# Patient Record
Sex: Female | Born: 1975 | Race: White | Hispanic: No | Marital: Married | Smoking: Never smoker
Health system: Southern US, Community
[De-identification: ages and names within clinical notes are randomized; demographics above are authoritative.]

## PROBLEM LIST (undated history)

## (undated) DIAGNOSIS — R87619 Unspecified abnormal cytological findings in specimens from cervix uteri: Secondary | ICD-10-CM

## (undated) DIAGNOSIS — D649 Anemia, unspecified: Secondary | ICD-10-CM

## (undated) DIAGNOSIS — Z87448 Personal history of other diseases of urinary system: Secondary | ICD-10-CM

## (undated) DIAGNOSIS — Z8661 Personal history of infections of the central nervous system: Secondary | ICD-10-CM

## (undated) DIAGNOSIS — Z87440 Personal history of urinary (tract) infections: Secondary | ICD-10-CM

## (undated) DIAGNOSIS — IMO0002 Reserved for concepts with insufficient information to code with codable children: Secondary | ICD-10-CM

## (undated) HISTORY — DX: Personal history of other diseases of urinary system: Z87.448

## (undated) HISTORY — PX: WISDOM TOOTH EXTRACTION: SHX21

## (undated) HISTORY — DX: Personal history of urinary (tract) infections: Z87.440

## (undated) HISTORY — DX: Personal history of infections of the central nervous system: Z86.61

## (undated) HISTORY — DX: Reserved for concepts with insufficient information to code with codable children: IMO0002

## (undated) HISTORY — PX: TONSILLECTOMY: SUR1361

## (undated) HISTORY — DX: Unspecified abnormal cytological findings in specimens from cervix uteri: R87.619

## (undated) HISTORY — DX: Anemia, unspecified: D64.9

---

## 2011-10-22 NOTE — L&D Delivery Note (Signed)
Delivery Note At 12:36 PM a viable and healthy female was delivered via Vaginal, Spontaneous Delivery (Presentation: Right Occiput Anterior).  APGAR: 9, 9; weight .   Placenta status: Intact, Spontaneous.  Cord: 3 vessels with the following complications: None, although question placental abruption at the time of delivery.   Anesthesia: Epidural  Episiotomy: None Lacerations: None Suture Repair: none Est. Blood Loss (mL): 300  Mom to postpartum.  Baby to nursery-stable.  Yannis Broce 06/29/2012, 1:08 PM

## 2011-10-22 NOTE — L&D Delivery Note (Signed)
I was present for the delivery and agree with above.  Susan Hayes, [place] 06/29/2012 7:13 PM

## 2011-12-16 ENCOUNTER — Encounter: Payer: Self-pay | Admitting: *Deleted

## 2011-12-16 ENCOUNTER — Ambulatory Visit: Payer: 59 | Admitting: *Deleted

## 2011-12-16 VITALS — BP 105/59 | Temp 98.5°F | Ht 63.0 in | Wt 118.0 lb

## 2011-12-16 DIAGNOSIS — Z3480 Encounter for supervision of other normal pregnancy, unspecified trimester: Secondary | ICD-10-CM

## 2011-12-16 NOTE — Progress Notes (Signed)
Pt and husband are here for NOB intake.  She is G4 P3 and delivered all babies @ [facility] with [place].  Couple are excited .  Husband has 2 children from a first marriage and they have 5 all together.  CRl today is 27mm and FHT 168bpm gest age is 8w 3 days. Which is appropriate for LMP.  PNL drawn today and is scheduled to return in 1-2 weeks for  Prenatal exam.  Pt to sign records for  Last pap which was 7 months ago post partum.

## 2011-12-17 LAB — OBSTETRIC PANEL
Basophils Absolute: 0 10*3/uL (ref 0.0–0.1)
HCT: 38.7 % (ref 36.0–46.0)
Hepatitis B Surface Ag: NEGATIVE
Lymphocytes Relative: 26 % (ref 12–46)
Monocytes Absolute: 0.5 10*3/uL (ref 0.1–1.0)
Neutro Abs: 7 10*3/uL (ref 1.7–7.7)
Platelets: 255 10*3/uL (ref 150–400)
RDW: 14.3 % (ref 11.5–15.5)
Rubella: 24.1 IU/mL — ABNORMAL HIGH
WBC: 10.3 10*3/uL (ref 4.0–10.5)

## 2011-12-19 LAB — CULTURE, URINE COMPREHENSIVE

## 2011-12-19 MED ORDER — NITROFURANTOIN MONOHYD MACRO 100 MG PO CAPS: 100.0000 mg | ORAL_CAPSULE | Freq: Two times a day (BID) | ORAL | Status: AC

## 2012-01-03 ENCOUNTER — Encounter: Payer: 59 | Admitting: Advanced Practice Midwife

## 2012-01-27 ENCOUNTER — Encounter: Payer: Self-pay | Admitting: Obstetrics and Gynecology

## 2012-01-27 ENCOUNTER — Ambulatory Visit (INDEPENDENT_AMBULATORY_CARE_PROVIDER_SITE_OTHER): Payer: 59 | Admitting: Obstetrics and Gynecology

## 2012-01-27 VITALS — BP 110/69 | Temp 98.6°F | Wt 125.0 lb

## 2012-01-27 DIAGNOSIS — Z124 Encounter for screening for malignant neoplasm of cervix: Secondary | ICD-10-CM

## 2012-01-27 DIAGNOSIS — Z3480 Encounter for supervision of other normal pregnancy, unspecified trimester: Secondary | ICD-10-CM

## 2012-01-27 DIAGNOSIS — Z1151 Encounter for screening for human papillomavirus (HPV): Secondary | ICD-10-CM

## 2012-01-27 DIAGNOSIS — Z113 Encounter for screening for infections with a predominantly sexual mode of transmission: Secondary | ICD-10-CM

## 2012-01-27 NOTE — Progress Notes (Signed)
[id number] 

## 2012-01-27 NOTE — Patient Instructions (Addendum)
Pregnancy - Second Trimester The second trimester of pregnancy (3 to 6 months) is a period of rapid growth for you and your baby. At the end of the sixth month, your baby is about 9 inches long and weighs 1 1/2 pounds. You will begin to feel the baby move between 18 and 20 weeks of the pregnancy. This is called quickening. Weight gain is faster. A clear fluid (colostrum) may leak out of your breasts. You may feel small contractions of the womb (uterus). This is known as false labor or Braxton-Hicks contractions. This is like a practice for labor when the baby is ready to be born. Usually, the problems with morning sickness have usually passed by the end of your first trimester. Some women develop small dark blotches (called cholasma, mask of pregnancy) on their face that usually goes away after the baby is born. Exposure to the sun makes the blotches worse. Acne may also develop in some pregnant women and pregnant women who have acne, may find that it goes away. PRENATAL EXAMS  Blood work may continue to be done during prenatal exams. These tests are done to check on your health and the probable health of your baby. Blood work is used to follow your blood levels (hemoglobin). Anemia (low hemoglobin) is common during pregnancy. Iron and vitamins are given to help prevent this. You will also be checked for diabetes between 24 and 28 weeks of the pregnancy. Some of the previous blood tests may be repeated.   The size of the uterus is measured during each visit. This is to make sure that the baby is continuing to grow properly according to the dates of the pregnancy.   Your blood pressure is checked every prenatal visit. This is to make sure you are not getting toxemia.   Your urine is checked to make sure you do not have an infection, diabetes or protein in the urine.   Your weight is checked often to make sure gains are happening at the suggested rate. This is to ensure that both you and your baby are  growing normally.   Sometimes, an ultrasound is performed to confirm the proper growth and development of the baby. This is a test which bounces harmless sound waves off the baby so your caregiver can more accurately determine due dates.  Sometimes, a specialized test is done on the amniotic fluid surrounding the baby. This test is called an amniocentesis. The amniotic fluid is obtained by sticking a needle into the belly (abdomen). This is done to check the chromosomes in instances where there is a concern about possible genetic problems with the baby. It is also sometimes done near the end of pregnancy if an early delivery is required. In this case, it is done to help make sure the baby's lungs are mature enough for the baby to live outside of the womb. CHANGES OCCURING IN THE SECOND TRIMESTER OF PREGNANCY Your body goes through many changes during pregnancy. They vary from person to person. Talk to your caregiver about changes you notice that you are concerned about.  During the second trimester, you will likely have an increase in your appetite. It is normal to have cravings for certain foods. This varies from person to person and pregnancy to pregnancy.   Your lower abdomen will begin to bulge.   You may have to urinate more often because the uterus and baby are pressing on your bladder. It is also common to get more bladder infections during pregnancy (  pain with urination). You can help this by drinking lots of fluids and emptying your bladder before and after intercourse.   You may begin to get stretch marks on your hips, abdomen, and breasts. These are normal changes in the body during pregnancy. There are no exercises or medications to take that prevent this change.   You may begin to develop swollen and bulging veins (varicose veins) in your legs. Wearing support hose, elevating your feet for 15 minutes, 3 to 4 times a day and limiting salt in your diet helps lessen the problem.    Heartburn may develop as the uterus grows and pushes up against the stomach. Antacids recommended by your caregiver helps with this problem. Also, eating smaller meals 4 to 5 times a day helps.   Constipation can be treated with a stool softener or adding bulk to your diet. Drinking lots of fluids, vegetables, fruits, and whole grains are helpful.   Exercising is also helpful. If you have been very active up until your pregnancy, most of these activities can be continued during your pregnancy. If you have been less active, it is helpful to start an exercise program such as walking.   Hemorrhoids (varicose veins in the rectum) may develop at the end of the second trimester. Warm sitz baths and hemorrhoid cream recommended by your caregiver helps hemorrhoid problems.   Backaches may develop during this time of your pregnancy. Avoid heavy lifting, wear low heal shoes and practice good posture to help with backache problems.   Some pregnant women develop tingling and numbness of their hand and fingers because of swelling and tightening of ligaments in the wrist (carpel tunnel syndrome). This goes away after the baby is born.   As your breasts enlarge, you may have to get a bigger bra. Get a comfortable, cotton, support bra. Do not get a nursing bra until the last month of the pregnancy if you will be nursing the baby.   You may get a dark line from your belly button to the pubic area called the linea nigra.   You may develop rosy cheeks because of increase blood flow to the face.   You may develop spider looking lines of the face, neck, arms and chest. These go away after the baby is born.  HOME CARE INSTRUCTIONS   It is extremely important to avoid all smoking, herbs, alcohol, and unprescribed drugs during your pregnancy. These chemicals affect the formation and growth of the baby. Avoid these chemicals throughout the pregnancy to ensure the delivery of a healthy infant.   Most of your home  care instructions are the same as suggested for the first trimester of your pregnancy. Keep your caregiver's appointments. Follow your caregiver's instructions regarding medication use, exercise and diet.   During pregnancy, you are providing food for you and your baby. Continue to eat regular, well-balanced meals. Choose foods such as meat, fish, milk and other low fat dairy products, vegetables, fruits, and whole-grain breads and cereals. Your caregiver will tell you of the ideal weight gain.   A physical sexual relationship may be continued up until near the end of pregnancy if there are no other problems. Problems could include early (premature) leaking of amniotic fluid from the membranes, vaginal bleeding, abdominal pain, or other medical or pregnancy problems.   Exercise regularly if there are no restrictions. Check with your caregiver if you are unsure of the safety of some of your exercises. The greatest weight gain will occur in the   last 2 trimesters of pregnancy. Exercise will help you:   Control your weight.   Get you in shape for labor and delivery.   Lose weight after you have the baby.   Wear a good support or jogging bra for breast tenderness during pregnancy. This may help if worn during sleep. Pads or tissues may be used in the bra if you are leaking colostrum.   Do not use hot tubs, steam rooms or saunas throughout the pregnancy.   Wear your seat belt at all times when driving. This protects you and your baby if you are in an accident.   Avoid raw meat, uncooked cheese, cat litter boxes and soil used by cats. These carry germs that can cause birth defects in the baby.   The second trimester is also a good time to visit your dentist for your dental health if this has not been done yet. Getting your teeth cleaned is OK. Use a soft toothbrush. Brush gently during pregnancy.   It is easier to loose urine during pregnancy. Tightening up and strengthening the pelvic muscles will  help with this problem. Practice stopping your urination while you are going to the bathroom. These are the same muscles you need to strengthen. It is also the muscles you would use as if you were trying to stop from passing gas. You can practice tightening these muscles up 10 times a set and repeating this about 3 times per day. Once you know what muscles to tighten up, do not perform these exercises during urination. It is more likely to contribute to an infection by backing up the urine.   Ask for help if you have financial, counseling or nutritional needs during pregnancy. Your caregiver will be able to offer counseling for these needs as well as refer you for other special needs.   Your skin may become oily. If so, wash your face with mild soap, use non-greasy moisturizer and oil or cream based makeup.  MEDICATIONS AND DRUG USE IN PREGNANCY  Take prenatal vitamins as directed. The vitamin should contain 1 milligram of folic acid. Keep all vitamins out of reach of children. Only a couple vitamins or tablets containing iron may be fatal to a baby or young child when ingested.   Avoid use of all medications, including herbs, over-the-counter medications, not prescribed or suggested by your caregiver. Only take over-the-counter or prescription medicines for pain, discomfort, or fever as directed by your caregiver. Do not use aspirin.   Let your caregiver also know about herbs you may be using.   Alcohol is related to a number of birth defects. This includes fetal alcohol syndrome. All alcohol, in any form, should be avoided completely. Smoking will cause low birth rate and premature babies.   Street or illegal drugs are very harmful to the baby. They are absolutely forbidden. A baby born to an addicted mother will be addicted at birth. The baby will go through the same withdrawal an adult does.  SEEK MEDICAL CARE IF:  You have any concerns or worries during your pregnancy. It is better to call with  your questions if you feel they cannot wait, rather than worry about them. SEEK IMMEDIATE MEDICAL CARE IF:   An unexplained oral temperature above 102 F (38.9 C) develops, or as your caregiver suggests.   You have leaking of fluid from the vagina (birth canal). If leaking membranes are suspected, take your temperature and tell your caregiver of this when you call.   There   is vaginal spotting, bleeding, or passing clots. Tell your caregiver of the amount and how many pads are used. Light spotting in pregnancy is common, especially following intercourse.   You develop a bad smelling vaginal discharge with a change in the color from clear to white.   You continue to feel sick to your stomach (nauseated) and have no relief from remedies suggested. You vomit blood or coffee ground-like materials.   You lose more than 2 pounds of weight or gain more than 2 pounds of weight over 1 week, or as suggested by your caregiver.   You notice swelling of your face, hands, feet, or legs.   You get exposed to [place] measles and have never had them.   You are exposed to fifth disease or chickenpox.   You develop belly (abdominal) pain. Round ligament discomfort is a common non-cancerous (benign) cause of abdominal pain in pregnancy. Your caregiver still must evaluate you.   You develop a bad headache that does not go away.   You develop fever, diarrhea, pain with urination, or shortness of breath.   You develop visual problems, blurry, or double vision.   You fall or are in a car accident or any kind of trauma.   There is mental or physical violence at home.  Document Released: 10/01/2001 Document Revised: 09/26/2011 Document Reviewed: 04/05/2009 [facility] Patient Information 2012 [place], [place]. Pregnancy - First Trimester During sexual intercourse, millions of sperm go into the vagina. Only 1 sperm will penetrate and fertilize the female egg while it is in the Fallopian tube. One week later, the  fertilized egg implants into the wall of the uterus. An embryo begins to develop into a baby. At 6 to 8 weeks, the eyes and face are formed and the heartbeat can be seen on ultrasound. At the end of 12 weeks (first trimester), all the baby's organs are formed. Now that you are pregnant, you will want to do everything you can to have a healthy baby. Two of the most important things are to get good prenatal care and follow your caregiver's instructions. Prenatal care is all the medical care you receive before the baby's birth. It is given to prevent, find, and treat problems during the pregnancy and childbirth. PRENATAL EXAMS  During prenatal visits, your weight, blood pressure and urine are checked. This is done to make sure you are healthy and progressing normally during the pregnancy.   A pregnant woman should gain 25 to 35 pounds during the pregnancy. However, if you are over weight or underweight, your caregiver will advise you regarding your weight.   Your caregiver will ask and answer questions for you.   Blood work, cervical cultures, other necessary tests and a Pap test are done during your prenatal exams. These tests are done to check on your health and the probable health of your baby. Tests are strongly recommended and done for HIV with your permission. This is the virus that causes AIDS. These tests are done because medications can be given to help prevent your baby from being born with this infection should you have been infected without knowing it. Blood work is also used to find out your blood type, previous infections and follow your blood levels (hemoglobin).   Low hemoglobin (anemia) is common during pregnancy. Iron and vitamins are given to help prevent this. Later in the pregnancy, blood tests for diabetes will be done along with any other tests if any problems develop. You may need tests to make sure  you and the baby are doing well.   You may need other tests to make sure you and the  baby are doing well.  CHANGES DURING THE FIRST TRIMESTER (THE FIRST 3 MONTHS OF PREGNANCY) Your body goes through many changes during pregnancy. They vary from person to person. Talk to your caregiver about changes you notice and are concerned about. Changes can include:  Your menstrual period stops.   The egg and sperm carry the genes that determine what you look like. Genes from you and your partner are forming a baby. The female genes determine whether the baby is a boy or a girl.   Your body increases in girth and you may feel bloated.   Feeling sick to your stomach (nauseous) and throwing up (vomiting). If the vomiting is uncontrollable, call your caregiver.   Your breasts will begin to enlarge and become tender.   Your nipples may stick out more and become darker.   The need to urinate more. Painful urination may mean you have a bladder infection.   Tiring easily.   Loss of appetite.   Cravings for certain kinds of food.   At first, you may gain or lose a couple of pounds.   You may have changes in your emotions from day to day (excited to be pregnant or concerned something may go wrong with the pregnancy and baby).   You may have more vivid and strange dreams.  HOME CARE INSTRUCTIONS   It is very important to avoid all smoking, alcohol and un-prescribed drugs during your pregnancy. These affect the formation and growth of the baby. Avoid chemicals while pregnant to ensure the delivery of a healthy infant.   Start your prenatal visits by the 12th week of pregnancy. They are usually scheduled monthly at first, then more often in the last 2 months before delivery. Keep your caregiver's appointments. Follow your caregiver's instructions regarding medication use, blood and lab tests, exercise, and diet.   During pregnancy, you are providing food for you and your baby. Eat regular, well-balanced meals. Choose foods such as meat, fish, milk and other low fat dairy products,  vegetables, fruits, and whole-grain breads and cereals. Your caregiver will tell you of the ideal weight gain.   You can help morning sickness by keeping soda crackers at the bedside. Eat a couple before arising in the morning. You may want to use the crackers without salt on them.   Eating 4 to 5 small meals rather than 3 large meals a day also may help the nausea and vomiting.   Drinking liquids between meals instead of during meals also seems to help nausea and vomiting.   A physical sexual relationship may be continued throughout pregnancy if there are no other problems. Problems may be early (premature) leaking of amniotic fluid from the membranes, vaginal bleeding, or belly (abdominal) pain.   Exercise regularly if there are no restrictions. Check with your caregiver or physical therapist if you are unsure of the safety of some of your exercises. Greater weight gain will occur in the last 2 trimesters of pregnancy. Exercising will help:   Control your weight.   Keep you in shape.   Prepare you for labor and delivery.   Help you lose your pregnancy weight after you deliver your baby.   Wear a good support or jogging bra for breast tenderness during pregnancy. This may help if worn during sleep too.   Ask when prenatal classes are available. Begin classes when they  are offered.   Do not use hot tubs, steam rooms or saunas.   Wear your seat belt when driving. This protects you and your baby if you are in an accident.   Avoid raw meat, uncooked cheese, cat litter boxes and soil used by cats throughout the pregnancy. These carry germs that can cause birth defects in the baby.   The first trimester is a good time to visit your dentist for your dental health. Getting your teeth cleaned is OK. Use a softer toothbrush and brush gently during pregnancy.   Ask for help if you have financial, counseling or nutritional needs during pregnancy. Your caregiver will be able to offer counseling  for these needs as well as refer you for other special needs.   Do not take any medications or herbs unless told by your caregiver.   Inform your caregiver if there is any mental or physical domestic violence.   Make a list of emergency phone numbers of family, friends, hospital, and police and fire departments.   Write down your questions. Take them to your prenatal visit.   Do not douche.   Do not cross your legs.   If you have to stand for long periods of time, rotate you feet or take small steps in a circle.   You may have more vaginal secretions that may require a sanitary pad. Do not use tampons or scented sanitary pads.  MEDICATIONS AND DRUG USE IN PREGNANCY  Take prenatal vitamins as directed. The vitamin should contain 1 milligram of folic acid. Keep all vitamins out of reach of children. Only a couple vitamins or tablets containing iron may be fatal to a baby or young child when ingested.   Avoid use of all medications, including herbs, over-the-counter medications, not prescribed or suggested by your caregiver. Only take over-the-counter or prescription medicines for pain, discomfort, or fever as directed by your caregiver. Do not use aspirin, ibuprofen, or naproxen unless directed by your caregiver.   Let your caregiver also know about herbs you may be using.   Alcohol is related to a number of birth defects. This includes fetal alcohol syndrome. All alcohol, in any form, should be avoided completely. Smoking will cause low birth rate and premature babies.   Street or illegal drugs are very harmful to the baby. They are absolutely forbidden. A baby born to an addicted mother will be addicted at birth. The baby will go through the same withdrawal an adult does.   Let your caregiver know about any medications that you have to take and for what reason you take them.  MISCARRIAGE IS COMMON DURING PREGNANCY A miscarriage does not mean you did something wrong. It is not a reason  to worry about getting pregnant again. Your caregiver will help you with questions you may have. If you have a miscarriage, you may need minor surgery. SEEK MEDICAL CARE IF:  You have any concerns or worries during your pregnancy. It is better to call with your questions if you feel they cannot wait, rather than worry about them. SEEK IMMEDIATE MEDICAL CARE IF:   An unexplained oral temperature above 102 F (38.9 C) develops, or as your caregiver suggests.   You have leaking of fluid from the vagina (birth canal). If leaking membranes are suspected, take your temperature and inform your caregiver of this when you call.   There is vaginal spotting or bleeding. Notify your caregiver of the amount and how many pads are used.   You  develop a bad smelling vaginal discharge with a change in the color.   You continue to feel sick to your stomach (nauseated) and have no relief from remedies suggested. You vomit blood or coffee ground-like materials.   You lose more than 2 pounds of weight in 1 week.   You gain more than 2 pounds of weight in 1 week and you notice swelling of your face, hands, feet, or legs.   You gain 5 pounds or more in 1 week (even if you do not have swelling of your hands, face, legs, or feet).   You get exposed to [place] measles and have never had them.   You are exposed to fifth disease or chickenpox.   You develop belly (abdominal) pain. Round ligament discomfort is a common non-cancerous (benign) cause of abdominal pain in pregnancy. Your caregiver still must evaluate this.   You develop headache, fever, diarrhea, pain with urination, or shortness of breath.   You fall or are in a car accident or have any kind of trauma.   There is mental or physical violence in your home.  Document Released: 10/01/2001 Document Revised: 09/26/2011 Document Reviewed: 04/04/2009 [facility] Patient Information 2012 [place], [place].

## 2012-01-27 NOTE — Progress Notes (Signed)
   Subjective:    Susan Hayes is a [id number] [redacted]w[redacted]d being seen today for her first obstetrical visit.  Her obstetrical history is significant for no OB RF. Marland Kitchen Patient does intend to breast feed. Pregnancy history fully reviewed.  Patient reports nausea and no cramping.  Filed Vitals:   01/27/12 1052  BP: 110/69  Temp: 98.6 F (37 C)  Weight: 125 lb (56.7 kg)    HISTORY: OB History    Grav Para Term Preterm Abortions TAB SAB Ect Mult Living   4 3 3       3      # Outc Date GA Lbr Len/2nd Wgt Sex Del Anes PTL Lv   1 TRM 4/03 [redacted]w[redacted]d  7lb(3.175kg) M SVD EPI No Yes   2 TRM 3/05 [redacted]w[redacted]d  7lb2oz(3.232kg) M SVD EPI No Yes   3 TRM 6/12 [redacted]w[redacted]d   F SVD None No Yes   4 CUR              Past Medical History  Diagnosis Date  . Anemia   . Abnormal Pap smear     Ascus  HPV   Past Surgical History  Procedure Date  . Tonsillectomy   . Wisdom tooth extraction    History reviewed. No pertinent family history.   Exam    Uterine Size: 2/3 to U  Pelvic Exam:    Perineum: Normal Perineum   Vulva: normal, Bartholin's, Urethra, Skene's normal, female escutcheon   Vagina:  normal mucosa, normal discharge       Cervix: multiparous appearance, no bleeding following Pap, no lesions and small cystocele   Adnexa: no mass, fullness, tenderness   Bony Pelvis: gynecoid  System: Breast:  normal appearance, no masses or tenderness   Skin: normal coloration and turgor, no rashes    Neurologic: oriented, normal, normal mood, oriented   Extremities: normal strength, tone, and muscle mass   HEENT PERRLA and thyroid without masses   Mouth/Teeth mucous membranes moist, pharynx normal without lesions   Neck supple and no masses   Cardiovascular: regular rate and rhythm   Respiratory:  appears well, vitals normal, no respiratory distress, acyanotic, normal RR, neck free of mass or lymphadenopathy, chest clear, no wheezing, crepitations, rhonchi, normal symmetric air entry   Abdomen: NT   Urinary: urethral  meatus normal and urethral meatus with mucosal prolapse      Assessment:    Pregnancy: [id number] Patient Active Problem List  Diagnoses  . Supervision of normal subsequent pregnancy        Plan:     Initial labs drawn. Prenatal vitamins. Problem list reviewed and updated. Genetic Screening discussed Quad Screen: undecided.  Ultrasound discussed; fetal survey: requested.  Follow up in 4 weeks. 50% of 30 min visit spent on counseling and coordination of care.  Explained midwifery care and visit schedule   Susan Hayes 01/27/2012

## 2012-01-30 LAB — OB RESULTS CONSOLE GC/CHLAMYDIA: Gonorrhea: NEGATIVE

## 2012-01-31 ENCOUNTER — Telehealth: Payer: Self-pay | Admitting: *Deleted

## 2012-01-31 NOTE — Telephone Encounter (Signed)
Appt for anatomy U/S scheduled for 02/24/12 @ 9:15 @ [facility]

## 2012-02-24 ENCOUNTER — Ambulatory Visit (HOSPITAL_COMMUNITY)
Admission: RE | Admit: 2012-02-24 | Discharge: 2012-02-24 | Disposition: A | Discharge status: Home / Self Care | Payer: 59 | Source: Ambulatory Visit | Attending: Obstetrics and Gynecology | Admitting: Obstetrics and Gynecology

## 2012-02-24 DIAGNOSIS — Z363 Encounter for antenatal screening for malformations: Secondary | ICD-10-CM | POA: Insufficient documentation

## 2012-02-24 DIAGNOSIS — O358XX0 Maternal care for other (suspected) fetal abnormality and damage, not applicable or unspecified: Secondary | ICD-10-CM | POA: Insufficient documentation

## 2012-02-24 DIAGNOSIS — Z1389 Encounter for screening for other disorder: Secondary | ICD-10-CM | POA: Insufficient documentation

## 2012-02-24 DIAGNOSIS — Z3480 Encounter for supervision of other normal pregnancy, unspecified trimester: Secondary | ICD-10-CM

## 2012-02-24 IMAGING — US US OB DETAIL+14 WK
1 series · 12 of 28 positions shown · non-contrast
Comparison: none

[Series 1: us ob detail +14 wk · 87 acquisitions, 12 frames shown]
[im 4/87]
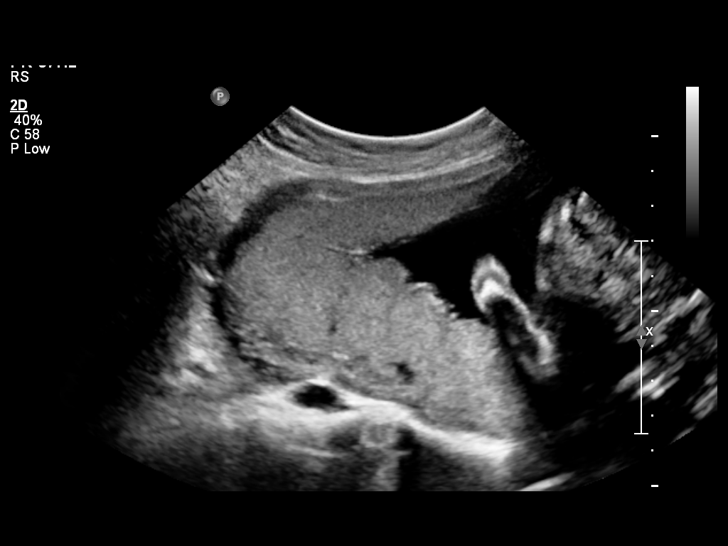
[im 10/87]
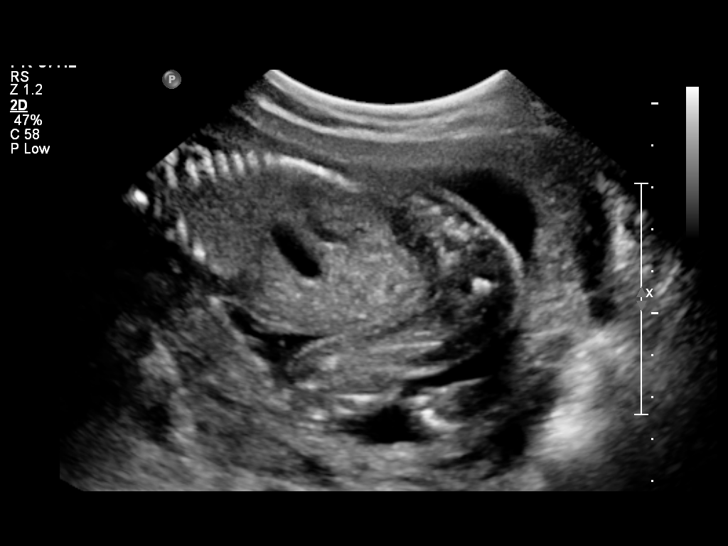
[im 16/87]
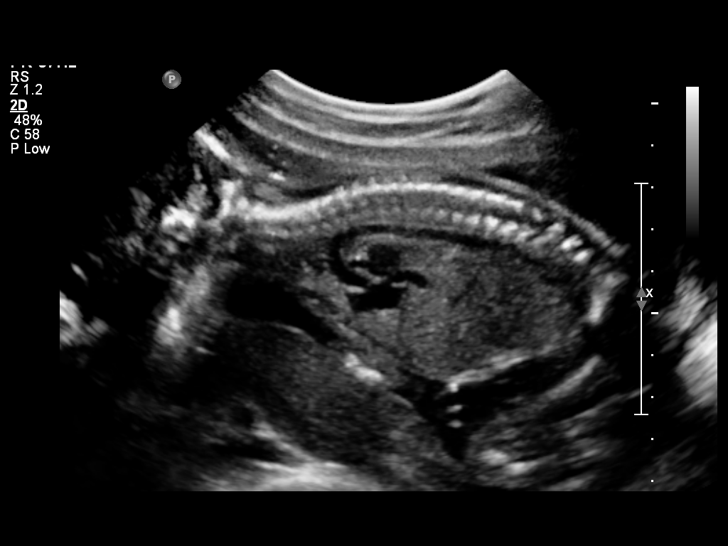
[im 26/87]
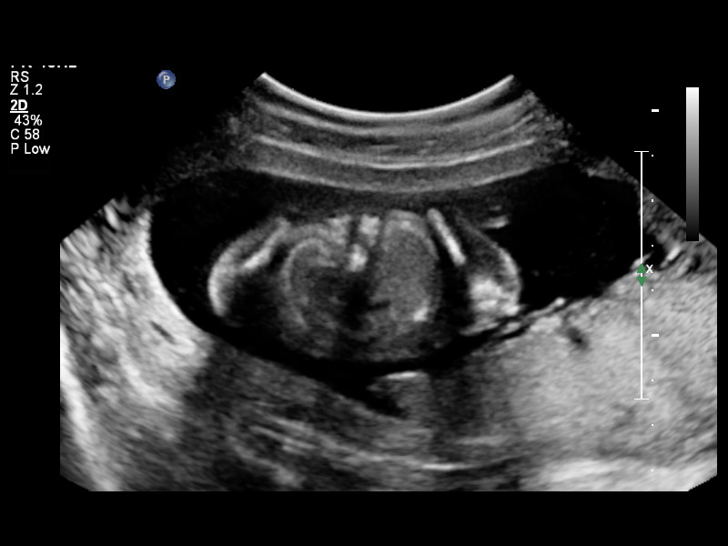
[im 32/87]
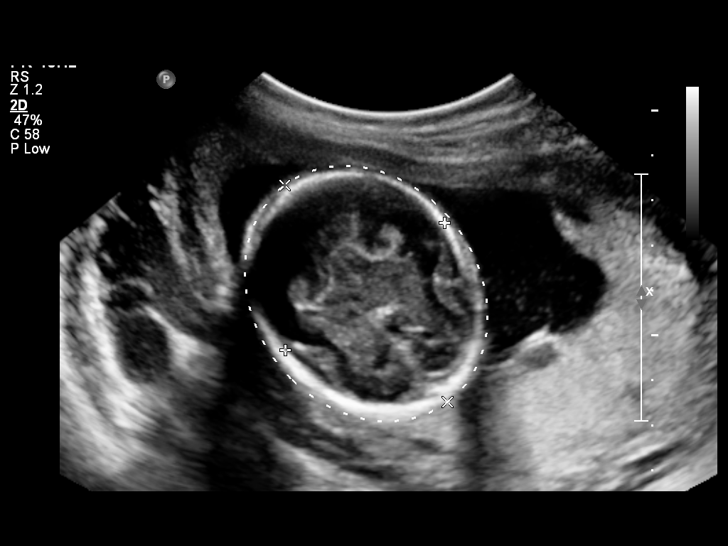
[im 39/87]
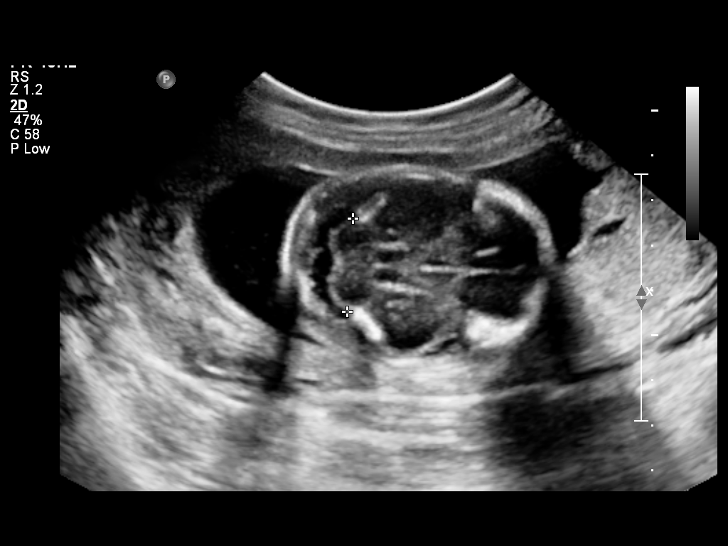
[im 48/87]
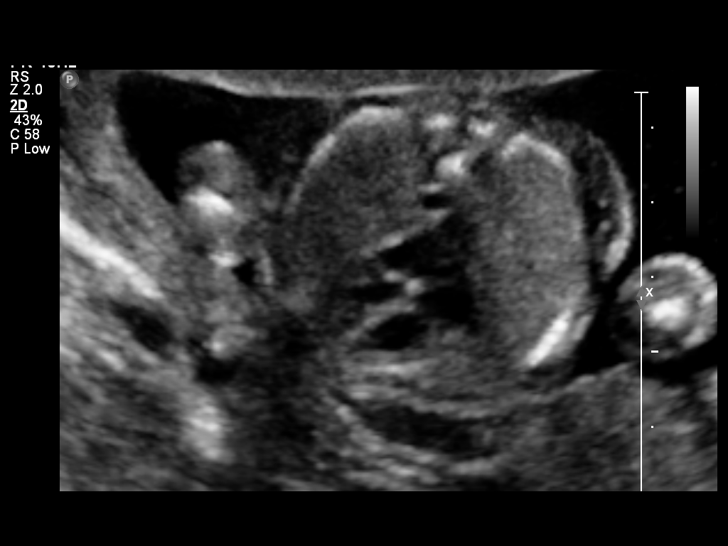
[im 55/87]
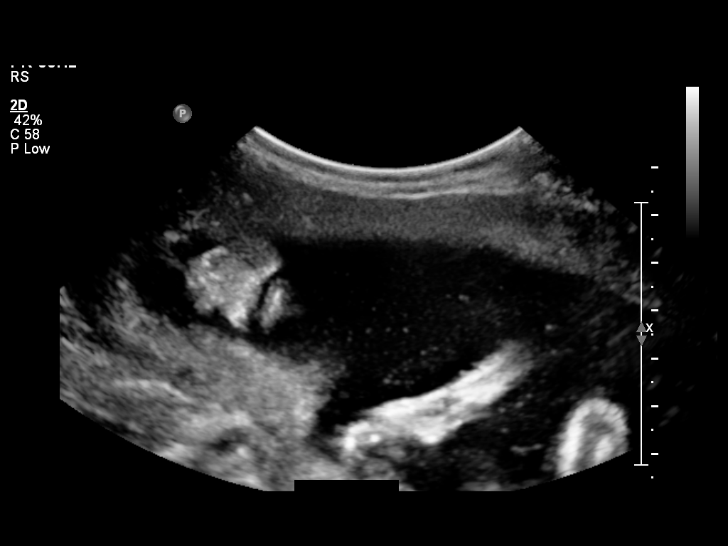
[im 61/87]
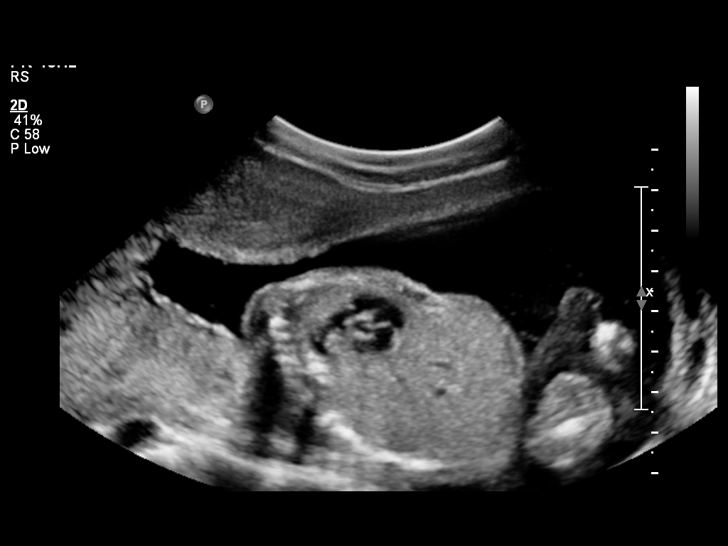
[im 71/87]
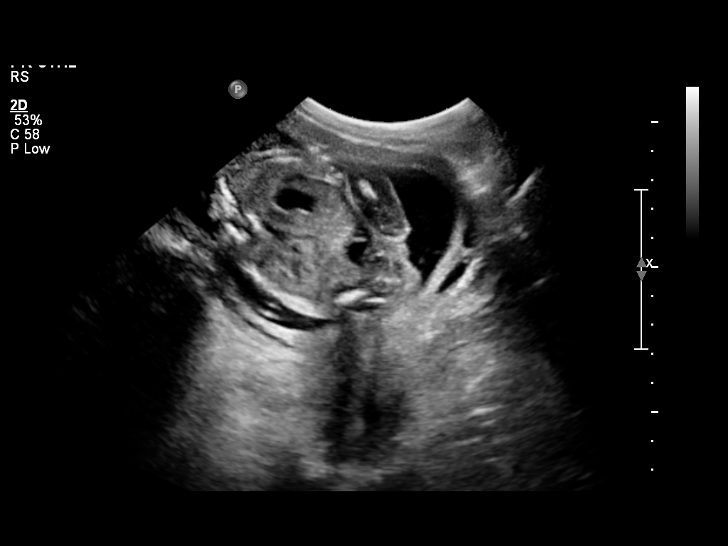
[im 77/87]
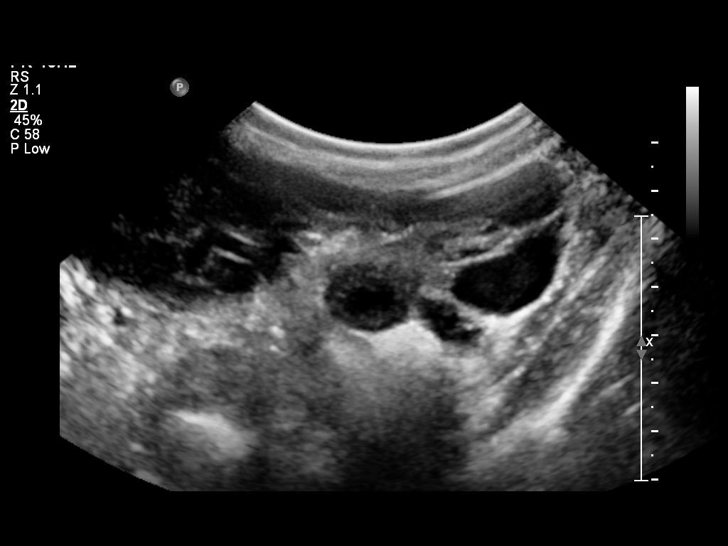
[im 83/87]
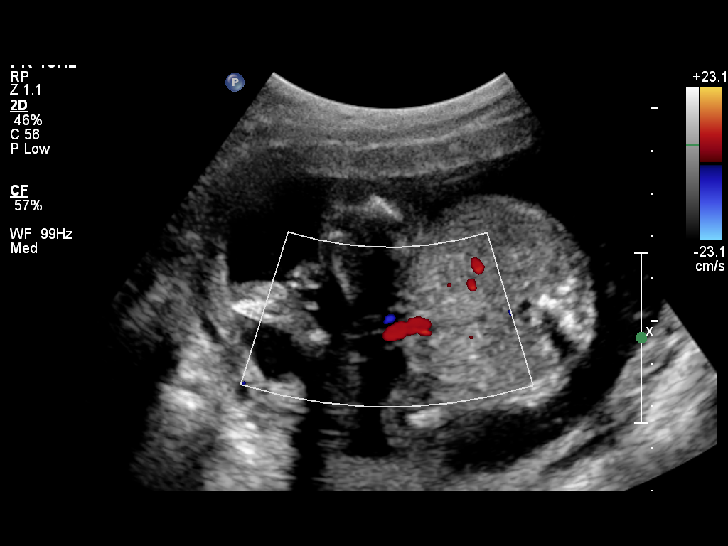

[12 of 28 positions shown; findings below may reference images not displayed]

OBSTETRICS REPORT
                      (Signed Final [DATE] [DATE])

 Order#:         [PHONE_NUMBER]_O
Procedures

 US OB DETAIL + 14 WK                                  76811.0
Indications

 Detailed fetal anatomic survey                        655.83 [WX]
Fetal Evaluation

 Fetal Heart Rate:  147                         bpm
 Cardiac Activity:  Observed
 Presentation:      Breech
 Placenta:          Fundal, above cervical os

 Amniotic Fluid
 AFI FV:      Subjectively within normal limits
                                             Larg Pckt:     4.1  cm
Biometry

 BPD:     45.1  mm    G. Age:   19w 4d                CI:         75.4   70 - 86
 OFD:     59.8  mm                                    FL/HC:      18.3   16.1 -

 HC:     172.9  mm    G. Age:   19w 6d       76  %    HC/AC:      1.15   1.09 -

 AC:     149.8  mm    G. Age:   20w 1d       79  %    FL/BPD:
 FL:      31.6  mm    G. Age:   19w 6d       67  %    FL/AC:      21.1   20 - 24
 HUM:     32.4  mm    G. Age:   20w 6d     > 95  %
 CER:     20.7  mm    G. Age:   19w 5d       62  %
 NFT:     3.78  mm

 Est. FW:     326  gm    0 lb 11 oz      58  %
Gestational Age

 LMP:           19w 1d       Date:   [DATE]                 EDD:   [DATE]
 U/S Today:     19w 6d                                        EDD:   [DATE]
 Best:          19w 1d    Det. By:   LMP  ([DATE])          EDD:   [DATE]
Anatomy

 Cranium:           Appears normal      Aortic Arch:       Appears normal
 Fetal Cavum:       Appears normal      Ductal Arch:       Not well
                                                           visualized
 Ventricles:        Appears normal      Diaphragm:         Appears normal
 Choroid Plexus:    Appears normal      Stomach:           Appears normal
 Cerebellum:        Appears normal      Abdomen:           Appears normal
 Posterior Fossa:   Appears normal      Abdominal Wall:    Appears nml
                                                           (cord insert,
                                                           abd wall)
 Nuchal Fold:       Appears normal      Cord Vessels:      Appears normal
                    (neck, nuchal                          (3 vessel
                    fold)                                  cord)at  real-
                                                           time img
 Face:              Appears normal      Kidneys:           Appear normal
                    (lips/profile/orbit
                    s)
 Heart:             Appears normal      Bladder:           Appears normal
                    (4 chamber &
                    axis)
 RVOT:              Appears normal      Spine:             Appears normal
 LVOT:              Appears normal      Limbs:             Four extremities
                                                           seen

 Other:     Heels and 5th digit visualized. Fetus appears to be a
            female.
Cervix Uterus Adnexa

 Cervical Length:   4.39      cm

 Cervix:       Normal appearance by transabdominal scan.
 Left Ovary:   Size(cm) L: 2.62 x W: 1.69 x H: 1.45  Volume(cc):
 Right Ovary:  Size(cm) L: 2.19 x W: 1.99 x H: 1.6  Volume(cc):
Impression

 Single live IUP in breech presentation.   Concordant
 measurements/assigned GA by LMP.
 No anatomic abnormality seen with a good quality survey
 possible. Ductal arch not well visualized due to fetal position.

 questions or concerns.

## 2012-02-26 ENCOUNTER — Encounter: Payer: 59 | Admitting: Obstetrics & Gynecology

## 2012-04-21 ENCOUNTER — Encounter: Payer: 59 | Admitting: Obstetrics & Gynecology

## 2012-05-01 ENCOUNTER — Encounter: Payer: 59 | Admitting: Advanced Practice Midwife

## 2012-05-29 ENCOUNTER — Ambulatory Visit (INDEPENDENT_AMBULATORY_CARE_PROVIDER_SITE_OTHER): Payer: 59 | Admitting: Family

## 2012-05-29 VITALS — BP 95/53 | Temp 98.0°F | Wt 137.0 lb

## 2012-05-29 DIAGNOSIS — Z3480 Encounter for supervision of other normal pregnancy, unspecified trimester: Secondary | ICD-10-CM

## 2012-05-29 NOTE — Progress Notes (Signed)
p-79 pt has not had 28 week labs done

## 2012-05-29 NOTE — Progress Notes (Signed)
No prob or concerns; missed OB appt since April, no reason given; explained needed 1 hr glucola, will come next week.

## 2012-06-05 ENCOUNTER — Other Ambulatory Visit: Payer: 59 | Admitting: *Deleted

## 2012-06-15 ENCOUNTER — Ambulatory Visit (INDEPENDENT_AMBULATORY_CARE_PROVIDER_SITE_OTHER): Payer: 59 | Admitting: Advanced Practice Midwife

## 2012-06-15 VITALS — BP 116/75 | Wt 139.0 lb

## 2012-06-15 DIAGNOSIS — Z87898 Personal history of other specified conditions: Secondary | ICD-10-CM | POA: Insufficient documentation

## 2012-06-15 DIAGNOSIS — Z3400 Encounter for supervision of normal first pregnancy, unspecified trimester: Secondary | ICD-10-CM

## 2012-06-15 DIAGNOSIS — Z8742 Personal history of other diseases of the female genital tract: Secondary | ICD-10-CM

## 2012-06-15 NOTE — Patient Instructions (Signed)
Pregnancy - Third Trimester The third trimester of pregnancy (the last 3 months) is a period of the most rapid growth for you and your baby. The baby approaches a length of 20 inches and a weight of 6 to 10 pounds. The baby is adding on fat and getting ready for life outside your body. While inside, babies have periods of sleeping and waking, suck their thumbs, and hiccups. You can often feel small contractions of the uterus. This is false labor. It is also called Braxton-Hicks contractions. This is like a practice for labor. The usual problems in this stage of pregnancy include more difficulty breathing, swelling of the hands and feet from water retention, and having to urinate more often because of the uterus and baby pressing on your bladder.  PRENATAL EXAMS  Blood work may continue to be done during prenatal exams. These tests are done to check on your health and the probable health of your baby. Blood work is used to follow your blood levels (hemoglobin). Anemia (low hemoglobin) is common during pregnancy. Iron and vitamins are given to help prevent this. You may also continue to be checked for diabetes. Some of the past blood tests may be done again.   The size of the uterus is measured during each visit. This makes sure your baby is growing properly according to your pregnancy dates.   Your blood pressure is checked every prenatal visit. This is to make sure you are not getting toxemia.   Your urine is checked every prenatal visit for infection, diabetes and protein.   Your weight is checked at each visit. This is done to make sure gains are happening at the suggested rate and that you and your baby are growing normally.   Sometimes, an ultrasound is performed to confirm the position and the proper growth and development of the baby. This is a test done that bounces harmless sound waves off the baby so your caregiver can more accurately determine due dates.   Discuss the type of pain  medication and anesthesia you will have during your labor and delivery.   Discuss the possibility and anesthesia if a Cesarean Section might be necessary.   Inform your caregiver if there is any mental or physical violence at home.  Sometimes, a specialized non-stress test, contraction stress test and biophysical profile are done to make sure the baby is not having a problem. Checking the amniotic fluid surrounding the baby is called an amniocentesis. The amniotic fluid is removed by sticking a needle into the belly (abdomen). This is sometimes done near the end of pregnancy if an early delivery is required. In this case, it is done to help make sure the baby's lungs are mature enough for the baby to live outside of the womb. If the lungs are not mature and it is unsafe to deliver the baby, an injection of cortisone medication is given to the mother 1 to 2 days before the delivery. This helps the baby's lungs mature and makes it safer to deliver the baby. CHANGES OCCURING IN THE THIRD TRIMESTER OF PREGNANCY Your body goes through many changes during pregnancy. They vary from person to person. Talk to your caregiver about changes you notice and are concerned about.  During the last trimester, you have probably had an increase in your appetite. It is normal to have cravings for certain foods. This varies from person to person and pregnancy to pregnancy.   You may begin to get stretch marks on your hips,   abdomen, and breasts. These are normal changes in the body during pregnancy. There are no exercises or medications to take which prevent this change.   Constipation may be treated with a stool softener or adding bulk to your diet. Drinking lots of fluids, fiber in vegetables, fruits, and whole grains are helpful.   Exercising is also helpful. If you have been very active up until your pregnancy, most of these activities can be continued during your pregnancy. If you have been less active, it is helpful  to start an exercise program such as walking. Consult your caregiver before starting exercise programs.   Avoid all smoking, alcohol, un-prescribed drugs, herbs and "street drugs" during your pregnancy. These chemicals affect the formation and growth of the baby. Avoid chemicals throughout the pregnancy to ensure the delivery of a healthy infant.   Backache, varicose veins and hemorrhoids may develop or get worse.   You will tire more easily in the third trimester, which is normal.   The baby's movements may be stronger and more often.   You may become short of breath easily.   Your belly button may stick out.   A yellow discharge may leak from your breasts called colostrum.   You may have a bloody mucus discharge. This usually occurs a few days to a week before labor begins.  HOME CARE INSTRUCTIONS   Keep your caregiver's appointments. Follow your caregiver's instructions regarding medication use, exercise, and diet.   During pregnancy, you are providing food for you and your baby. Continue to eat regular, well-balanced meals. Choose foods such as meat, fish, milk and other low fat dairy products, vegetables, fruits, and whole-grain breads and cereals. Your caregiver will tell you of the ideal weight gain.   A physical sexual relationship may be continued throughout pregnancy if there are no other problems such as early (premature) leaking of amniotic fluid from the membranes, vaginal bleeding, or belly (abdominal) pain.   Exercise regularly if there are no restrictions. Check with your caregiver if you are unsure of the safety of your exercises. Greater weight gain will occur in the last 2 trimesters of pregnancy. Exercising helps:   Control your weight.   Get you in shape for labor and delivery.   You lose weight after you deliver.   Rest a lot with legs elevated, or as needed for leg cramps or low back pain.   Wear a good support or jogging bra for breast tenderness during  pregnancy. This may help if worn during sleep. Pads or tissues may be used in the bra if you are leaking colostrum.   Do not use hot tubs, steam rooms, or saunas.   Wear your seat belt when driving. This protects you and your baby if you are in an accident.   Avoid raw meat, cat litter boxes and soil used by cats. These carry germs that can cause birth defects in the baby.   It is easier to loose urine during pregnancy. Tightening up and strengthening the pelvic muscles will help with this problem. You can practice stopping your urination while you are going to the bathroom. These are the same muscles you need to strengthen. It is also the muscles you would use if you were trying to stop from passing gas. You can practice tightening these muscles up 10 times a set and repeating this about 3 times per day. Once you know what muscles to tighten up, do not perform these exercises during urination. It is more likely   to cause an infection by backing up the urine.   Ask for help if you have financial, counseling or nutritional needs during pregnancy. Your caregiver will be able to offer counseling for these needs as well as refer you for other special needs.   Make a list of emergency phone numbers and have them available.   Plan on getting help from family or friends when you go home from the hospital.   Make a trial run to the hospital.   Take prenatal classes with the father to understand, practice and ask questions about the labor and delivery.   Prepare the baby's room/nursery.   Do not travel out of the city unless it is absolutely necessary and with the advice of your caregiver.   Wear only low or no heal shoes to have better balance and prevent falling.  MEDICATIONS AND DRUG USE IN PREGNANCY  Take prenatal vitamins as directed. The vitamin should contain 1 milligram of folic acid. Keep all vitamins out of reach of children. Only a couple vitamins or tablets containing iron may be fatal  to a baby or young child when ingested.   Avoid use of all medications, including herbs, over-the-counter medications, not prescribed or suggested by your caregiver. Only take over-the-counter or prescription medicines for pain, discomfort, or fever as directed by your caregiver. Do not use aspirin, ibuprofen (Motrin, Advil, Nuprin) or naproxen (Aleve) unless OK'd by your caregiver.   Let your caregiver also know about herbs you may be using.   Alcohol is related to a number of birth defects. This includes fetal alcohol syndrome. All alcohol, in any form, should be avoided completely. Smoking will cause low birth rate and premature babies.   Street/illegal drugs are very harmful to the baby. They are absolutely forbidden. A baby born to an addicted mother will be addicted at birth. The baby will go through the same withdrawal an adult does.  SEEK MEDICAL CARE IF: You have any concerns or worries during your pregnancy. It is better to call with your questions if you feel they cannot wait, rather than worry about them. DECISIONS ABOUT CIRCUMCISION You may or may not know the sex of your baby. If you know your baby is a boy, it may be time to think about circumcision. Circumcision is the removal of the foreskin of the penis. This is the skin that covers the sensitive end of the penis. There is no proven medical need for this. Often this decision is made on what is popular at the time or based upon religious beliefs and social issues. You can discuss these issues with your caregiver or pediatrician. SEEK IMMEDIATE MEDICAL CARE IF:   An unexplained oral temperature above 102 F (38.9 C) develops, or as your caregiver suggests.   You have leaking of fluid from the vagina (birth canal). If leaking membranes are suspected, take your temperature and tell your caregiver of this when you call.   There is vaginal spotting, bleeding or passing clots. Tell your caregiver of the amount and how many pads are  used.   You develop a bad smelling vaginal discharge with a change in the color from clear to white.   You develop vomiting that lasts more than 24 hours.   You develop chills or fever.   You develop shortness of breath.   You develop burning on urination.   You loose more than 2 pounds of weight or gain more than 2 pounds of weight or as suggested by your   caregiver.   You notice sudden swelling of your face, hands, and feet or legs.   You develop belly (abdominal) pain. Round ligament discomfort is a common non-cancerous (benign) cause of abdominal pain in pregnancy. Your caregiver still must evaluate you.   You develop a severe headache that does not go away.   You develop visual problems, blurred or double vision.   If you have not felt your baby move for more than 1 hour. If you think the baby is not moving as much as usual, eat something with sugar in it and lie down on your left side for an hour. The baby should move at least 4 to 5 times per hour. Call right away if your baby moves less than that.   You fall, are in a car accident or any kind of trauma.   There is mental or physical violence at home.  Document Released: 10/01/2001 Document Revised: 09/26/2011 Document Reviewed: 04/05/2009 [employer] Patient Information 2012 [employer]. 

## 2012-06-15 NOTE — Progress Notes (Signed)
Will do Glucola today, since not done last visit.  No history of diabetes. States feels well. Plan cultures and GBS next visit.

## 2012-06-17 ENCOUNTER — Other Ambulatory Visit: Payer: 59 | Admitting: *Deleted

## 2012-06-28 ENCOUNTER — Inpatient Hospital Stay (HOSPITAL_COMMUNITY)
Admission: AD | Admit: 2012-06-28 | Discharge: 2012-07-01 | DRG: 775 | Disposition: A | Discharge status: Home / Self Care | Payer: 59 | Source: Ambulatory Visit | Attending: Obstetrics & Gynecology | Admitting: Obstetrics & Gynecology

## 2012-06-28 ENCOUNTER — Encounter (HOSPITAL_COMMUNITY): Payer: Self-pay | Admitting: *Deleted

## 2012-06-28 DIAGNOSIS — O411090 Infection of amniotic sac and membranes, unspecified, unspecified trimester, not applicable or unspecified: Principal | ICD-10-CM | POA: Diagnosis present

## 2012-06-28 LAB — CBC
HCT: 25 % — ABNORMAL LOW (ref 36.0–46.0)
Hemoglobin: 8.3 g/dL — ABNORMAL LOW (ref 12.0–15.0)
MCHC: 33.2 g/dL (ref 30.0–36.0)
MCV: 81.2 fL (ref 78.0–100.0)

## 2012-06-28 LAB — URINE MICROSCOPIC-ADD ON

## 2012-06-28 LAB — URINALYSIS, ROUTINE W REFLEX MICROSCOPIC
Glucose, UA: NEGATIVE mg/dL
Ketones, ur: NEGATIVE mg/dL
pH: 6.5 (ref 5.0–8.0)

## 2012-06-28 MED ORDER — LACTATED RINGERS IV SOLN
INTRAVENOUS | Status: DC
  Administered 2012-06-28 – 2012-06-29 (×3): via INTRAVENOUS

## 2012-06-28 MED ORDER — OXYCODONE-ACETAMINOPHEN 5-325 MG PO TABS: 1.0000 | ORAL_TABLET | ORAL | Status: DC | PRN

## 2012-06-28 MED ORDER — SODIUM CHLORIDE 0.9 % IV SOLN
1.0000 g | Freq: Four times a day (QID) | INTRAVENOUS | Status: DC
  Administered 2012-06-29 (×2): 1 g via INTRAVENOUS
  Filled 2012-06-28 (×5): qty 1000

## 2012-06-28 MED ORDER — FLEET ENEMA 7-19 GM/118ML RE ENEM: 1.0000 | ENEMA | RECTAL | Status: DC | PRN

## 2012-06-28 MED ORDER — IBUPROFEN 600 MG PO TABS: 600.0000 mg | ORAL_TABLET | Freq: Four times a day (QID) | ORAL | Status: DC | PRN

## 2012-06-28 MED ORDER — CITRIC ACID-SODIUM CITRATE 334-500 MG/5ML PO SOLN: 30.0000 mL | ORAL | Status: DC | PRN

## 2012-06-28 MED ORDER — LACTATED RINGERS IV SOLN
500.0000 mL | INTRAVENOUS | Status: DC | PRN
  Administered 2012-06-28 – 2012-06-29 (×2): 300 mL via INTRAVENOUS

## 2012-06-28 MED ORDER — SODIUM CHLORIDE 0.9 % IV SOLN
2.0000 g | Freq: Once | INTRAVENOUS | Status: AC
  Administered 2012-06-28: 2 g via INTRAVENOUS
  Filled 2012-06-28: qty 2000

## 2012-06-28 MED ORDER — ONDANSETRON HCL 4 MG/2ML IJ SOLN: 4.0000 mg | Freq: Four times a day (QID) | INTRAMUSCULAR | Status: DC | PRN

## 2012-06-28 MED ORDER — ACETAMINOPHEN 500 MG PO TABS
1000.0000 mg | ORAL_TABLET | Freq: Once | ORAL | Status: AC
  Administered 2012-06-28: 1000 mg via ORAL
  Filled 2012-06-28: qty 2

## 2012-06-28 MED ORDER — FENTANYL CITRATE 0.05 MG/ML IJ SOLN: 100.0000 ug | INTRAMUSCULAR | Status: DC | PRN

## 2012-06-28 MED ORDER — ACETAMINOPHEN 325 MG PO TABS
650.0000 mg | ORAL_TABLET | ORAL | Status: DC | PRN
  Administered 2012-06-29: 650 mg via ORAL
  Filled 2012-06-28 (×2): qty 2

## 2012-06-28 MED ORDER — GENTAMICIN SULFATE 40 MG/ML IJ SOLN
170.0000 mg | Freq: Three times a day (TID) | INTRAVENOUS | Status: DC
  Administered 2012-06-28 – 2012-06-29 (×2): 170 mg via INTRAVENOUS
  Filled 2012-06-28 (×4): qty 4.25

## 2012-06-28 MED ORDER — OXYTOCIN BOLUS FROM INFUSION
500.0000 mL | Freq: Once | INTRAVENOUS | Status: DC
  Filled 2012-06-28: qty 500

## 2012-06-28 MED ORDER — LIDOCAINE HCL (PF) 1 % IJ SOLN: 30.0000 mL | INTRAMUSCULAR | Status: DC | PRN

## 2012-06-28 MED ORDER — OXYTOCIN 40 UNITS IN LACTATED RINGERS INFUSION - SIMPLE MED
62.5000 mL/h | Freq: Once | INTRAVENOUS | Status: DC
  Filled 2012-06-28: qty 1000

## 2012-06-28 NOTE — Progress Notes (Signed)
ANTIBIOTIC CONSULT NOTE - INITIAL  Pharmacy Consult for Gentamicin Indication: maternal fever  No Known Allergies  Patient Measurements: Height: 5\' 4"  (162.6 cm) Weight: 143 lb (64.864 kg) IBW/kg (Calculated) : 54.7   Vital Signs: Temp: 100.7 F (38.2 C) (09/08 2122) Temp src: Axillary (09/08 2122) BP: 105/74 mmHg (09/08 2203) Pulse Rate: 97  (09/08 2210)  Labs:  [facility] 06/28/12 2045  WBC 23.5*  HGB 8.3*  PLT 141*  [facility] --  CREATININE --   Medical History: Past Medical History  Diagnosis Date  . Anemia   . Abnormal Pap smear     Ascus  HPV   Assessment: Pt is a 36 yo G4P3 being initiated on ampicillin and gentamicin during labor for maternal fever/rule out chorio.   Goal of Therapy:  Gentamicin peak 6-8 mcg/ml   Gentamicin trough <1 mcg/ml  Plan:  Gentamicin 170mg  IV q8h Check SCr if continued post-partum  Kandice Schmelter [place] 06/28/2012,10:31 PM

## 2012-06-28 NOTE — Progress Notes (Signed)
Susan Hayes is a 36 y.o. G4P3003 at [redacted]w[redacted]d by ultrasound admitted for labor and suspected chorio  Subjective: Feeling better after Tylenol, having some painful ctx  Objective: BP 97/57  Pulse 95  Temp 100.2 F (37.9 C) (Axillary)  Resp 20  Ht 5\' 4"  (1.626 m)  Wt 64.864 kg (143 lb)  BMI 24.55 kg/m2  SpO2 98%  LMP 10/13/2011  Breastfeeding? Unknown      FHT:  FHR: 130 bpm, variability: moderate,  accelerations:  Present,  decelerations:  Absent UC:   regular, every 2-4 minutes SVE:   5-6/80/-2, membranes swept Labs: Lab Results  Component Value Date   WBC 23.5* 06/28/2012   HGB 8.3* 06/28/2012   HCT 25.0* 06/28/2012   MCV 81.2 06/28/2012   PLT 141* 06/28/2012    Assessment / Plan: IUP@[redacted]w[redacted]d  Suspected Chorioamnionitis  Continue efm, abx, anticipate SVD Lawernce Pitts 06/28/2012, 11:51 PM

## 2012-06-28 NOTE — H&P (Signed)
Susan Hayes is a 36 y.o. female presenting for regular, painful ctx. Reports N,V,D and chills beginning this morning, states daughter has also been sick.  Maternal Medical History:  Reason for admission: Reason for admission: contractions.  Contractions: Onset was 3-5 hours ago.   Frequency: regular.   Perceived severity is moderate.    Fetal activity: Perceived fetal activity is normal.   Last perceived fetal movement was within the past hour.      OB History    Grav Para Term Preterm Abortions TAB SAB Ect Mult Living   4 3 3       3      Past Medical History  Diagnosis Date  . Anemia   . Abnormal Pap smear     Ascus  HPV   Past Surgical History  Procedure Date  . Tonsillectomy   . Wisdom tooth extraction    Family History: family history is not on file. Social History:  reports that she has never smoked. She has never used smokeless tobacco. She reports that she does not drink alcohol or use illicit drugs.   Prenatal Transfer Tool  Maternal Diabetes: No Genetic Screening:Late to care Maternal Ultrasounds/Referrals: Normal Fetal Ultrasounds or other Referrals:  None Maternal Substance Abuse:  No Significant Maternal Medications:  None Significant Maternal Lab Results:  None Other Comments:  None  Review of Systems  Constitutional: Positive for fever, chills and malaise/fatigue.  HENT: Negative.   Eyes: Negative.   Respiratory: Negative.   Cardiovascular: Negative.   Gastrointestinal: Negative.   Genitourinary: Negative.   Musculoskeletal: Negative.   Neurological: Negative.   Endo/Heme/Allergies: Negative.   Psychiatric/Behavioral: Negative.     Dilation: 5 Effacement (%): 60 Station: -2 Exam by:: Elie Confer RN Blood pressure 103/59, pulse 93, temperature 100.7 F (38.2 C), temperature source Axillary, last menstrual period 10/13/2011, SpO2 98.00%, unknown if currently breastfeeding.  Maternal Exam:  Uterine Assessment: Contraction strength is moderate.   Contraction frequency is regular.   Abdomen: Patient reports no abdominal tenderness. Estimated fetal weight is 7 lbs.   Fetal presentation: vertex     Fetal Exam Fetal Monitor Review: Mode: ultrasound.   Baseline rate: 165.  Variability: moderate (6-25 bpm).   Pattern: accelerations present and no decelerations.    Fetal State Assessment: Category II - tracings are indeterminate.     Abdomen: Patient reports LLQ abdominal tenderness. Estimated fetal weight is 7 lbs.   Fetal presentation: vertex      Physical Exam  Constitutional: She is oriented to person, place, and time. She appears well-developed.  HENT:  Head: Normocephalic.  Neck: Normal range of motion.  Cardiovascular: Regular rhythm.   Respiratory: Effort normal.  GI: Soft.  Musculoskeletal: Normal range of motion.  Neurological: She is alert and oriented to person, place, and time.  Skin: Skin is dry.  Psychiatric: She has a normal mood and affect.  Skin is dry and hot   Prenatal labs: ABO, Rh: O/POS/-- (02/25 1518) Antibody: NEG (02/25 1518) Rubella: 24.1 (02/25 1518) RPR: NON REAC (02/25 1518)  HBsAg: NEGATIVE (02/25 1518)  HIV: NON REACTIVE (02/25 1518)  GBS:   unknown  Assessment/Plan: IUP@[redacted]w[redacted]d  Active labor  Admit, efm per unit policy, anticipate SVD  Lawernce Pitts 06/28/2012, 9:45 PM

## 2012-06-28 NOTE — MAU Note (Signed)
Pt states she started contracting every three minutes since 5:00.  Says she has been leaking very sm amt watery discharge.

## 2012-06-29 ENCOUNTER — Inpatient Hospital Stay (HOSPITAL_COMMUNITY): Payer: 59 | Admitting: Anesthesiology

## 2012-06-29 ENCOUNTER — Encounter (HOSPITAL_COMMUNITY): Payer: Self-pay | Admitting: Anesthesiology

## 2012-06-29 ENCOUNTER — Encounter (HOSPITAL_COMMUNITY): Payer: Self-pay | Admitting: *Deleted

## 2012-06-29 ENCOUNTER — Encounter: Payer: 59 | Admitting: Obstetrics and Gynecology

## 2012-06-29 DIAGNOSIS — O411090 Infection of amniotic sac and membranes, unspecified, unspecified trimester, not applicable or unspecified: Secondary | ICD-10-CM

## 2012-06-29 MED ORDER — LACTATED RINGERS IV SOLN
500.0000 mL | Freq: Once | INTRAVENOUS | Status: AC
  Administered 2012-06-29: 500 mL via INTRAVENOUS

## 2012-06-29 MED ORDER — TERBUTALINE SULFATE 1 MG/ML IJ SOLN: 0.2500 mg | Freq: Once | INTRAMUSCULAR | Status: DC | PRN

## 2012-06-29 MED ORDER — ZOLPIDEM TARTRATE 5 MG PO TABS: 5.0000 mg | ORAL_TABLET | Freq: Every evening | ORAL | Status: DC | PRN

## 2012-06-29 MED ORDER — TETANUS-DIPHTH-ACELL PERTUSSIS 5-2.5-18.5 LF-MCG/0.5 IM SUSP
0.5000 mL | Freq: Once | INTRAMUSCULAR | Status: AC
  Administered 2012-06-30: 0.5 mL via INTRAMUSCULAR
  Filled 2012-06-29: qty 0.5

## 2012-06-29 MED ORDER — DIBUCAINE 1 % RE OINT: 1.0000 "application " | TOPICAL_OINTMENT | RECTAL | Status: DC | PRN

## 2012-06-29 MED ORDER — PRENATAL MULTIVITAMIN CH
1.0000 | ORAL_TABLET | Freq: Every day | ORAL | Status: DC
  Administered 2012-06-29 – 2012-07-01 (×3): 1 via ORAL
  Filled 2012-06-29 (×3): qty 1

## 2012-06-29 MED ORDER — FENTANYL 2.5 MCG/ML BUPIVACAINE 1/10 % EPIDURAL INFUSION (WH - ANES)
14.0000 mL/h | INTRAMUSCULAR | Status: DC
  Administered 2012-06-29 (×3): 14 mL/h via EPIDURAL
  Filled 2012-06-29 (×3): qty 60

## 2012-06-29 MED ORDER — PHENYLEPHRINE 40 MCG/ML (10ML) SYRINGE FOR IV PUSH (FOR BLOOD PRESSURE SUPPORT): 80.0000 ug | PREFILLED_SYRINGE | INTRAVENOUS | Status: DC | PRN

## 2012-06-29 MED ORDER — WITCH HAZEL-GLYCERIN EX PADS: 1.0000 "application " | MEDICATED_PAD | CUTANEOUS | Status: DC | PRN

## 2012-06-29 MED ORDER — ONDANSETRON HCL 4 MG/2ML IJ SOLN: 4.0000 mg | INTRAMUSCULAR | Status: DC | PRN

## 2012-06-29 MED ORDER — ONDANSETRON HCL 4 MG PO TABS: 4.0000 mg | ORAL_TABLET | ORAL | Status: DC | PRN

## 2012-06-29 MED ORDER — OXYCODONE-ACETAMINOPHEN 5-325 MG PO TABS
1.0000 | ORAL_TABLET | ORAL | Status: DC | PRN
  Administered 2012-06-30 – 2012-07-01 (×3): 1 via ORAL
  Filled 2012-06-29 (×3): qty 1

## 2012-06-29 MED ORDER — DIPHENHYDRAMINE HCL 50 MG/ML IJ SOLN: 12.5000 mg | INTRAMUSCULAR | Status: DC | PRN

## 2012-06-29 MED ORDER — LIDOCAINE HCL (PF) 1 % IJ SOLN
INTRAMUSCULAR | Status: DC | PRN
  Administered 2012-06-29 (×3): 4 mL

## 2012-06-29 MED ORDER — OXYTOCIN 40 UNITS IN LACTATED RINGERS INFUSION - SIMPLE MED
1.0000 m[IU]/min | INTRAVENOUS | Status: DC
  Administered 2012-06-29: 666 m[IU]/min via INTRAVENOUS
  Administered 2012-06-29: 2 m[IU]/min via INTRAVENOUS

## 2012-06-29 MED ORDER — EPHEDRINE 5 MG/ML INJ: 10.0000 mg | INTRAVENOUS | Status: DC | PRN

## 2012-06-29 MED ORDER — LANOLIN HYDROUS EX OINT: TOPICAL_OINTMENT | CUTANEOUS | Status: DC | PRN

## 2012-06-29 MED ORDER — SENNOSIDES-DOCUSATE SODIUM 8.6-50 MG PO TABS
2.0000 | ORAL_TABLET | Freq: Every day | ORAL | Status: DC
  Administered 2012-06-29 – 2012-06-30 (×2): 2 via ORAL

## 2012-06-29 MED ORDER — FERROUS SULFATE 325 (65 FE) MG PO TABS
325.0000 mg | ORAL_TABLET | Freq: Two times a day (BID) | ORAL | Status: DC
  Administered 2012-06-30 – 2012-07-01 (×3): 325 mg via ORAL
  Filled 2012-06-29 (×3): qty 1

## 2012-06-29 MED ORDER — SIMETHICONE 80 MG PO CHEW: 80.0000 mg | CHEWABLE_TABLET | ORAL | Status: DC | PRN

## 2012-06-29 MED ORDER — PHENYLEPHRINE 40 MCG/ML (10ML) SYRINGE FOR IV PUSH (FOR BLOOD PRESSURE SUPPORT)
80.0000 ug | PREFILLED_SYRINGE | INTRAVENOUS | Status: DC | PRN
  Filled 2012-06-29: qty 5

## 2012-06-29 MED ORDER — DIPHENHYDRAMINE HCL 25 MG PO CAPS: 25.0000 mg | ORAL_CAPSULE | Freq: Four times a day (QID) | ORAL | Status: DC | PRN

## 2012-06-29 MED ORDER — EPHEDRINE 5 MG/ML INJ
10.0000 mg | INTRAVENOUS | Status: DC | PRN
  Administered 2012-06-29: 10 mg via INTRAVENOUS
  Filled 2012-06-29: qty 4

## 2012-06-29 MED ORDER — IBUPROFEN 600 MG PO TABS
600.0000 mg | ORAL_TABLET | Freq: Four times a day (QID) | ORAL | Status: DC
  Administered 2012-06-29 – 2012-07-01 (×7): 600 mg via ORAL
  Filled 2012-06-29 (×7): qty 1

## 2012-06-29 MED ORDER — BENZOCAINE-MENTHOL 20-0.5 % EX AERO: 1.0000 "application " | INHALATION_SPRAY | CUTANEOUS | Status: DC | PRN

## 2012-06-29 NOTE — Anesthesia Procedure Notes (Signed)
Epidural Patient location during procedure: OB Start time: 06/29/2012 1:38 AM  Staffing Performed by: anesthesiologist   Preanesthetic Checklist Completed: patient identified, site marked, surgical consent, pre-op evaluation, timeout performed, IV checked, risks and benefits discussed and monitors and equipment checked  Epidural Patient position: sitting Prep: site prepped and draped and DuraPrep Patient monitoring: continuous pulse ox and blood pressure Approach: midline Injection technique: LOR air  Needle:  Needle type: Tuohy  Needle gauge: 17 G Needle length: 9 cm and 9 Needle insertion depth: 4 cm Catheter type: closed end flexible Catheter size: 19 Gauge Catheter at skin depth: 9 cm Test dose: negative  Assessment Events: blood not aspirated, injection not painful, no injection resistance, negative IV test and no paresthesia  Additional Notes Discussed risk of headache, infection, bleeding, nerve injury and failed or incomplete block.  Patient voices understanding and wishes to proceed. Reason for block:procedure for pain

## 2012-06-29 NOTE — Progress Notes (Signed)
Susan Hayes is a 36 y.o. [phone] at [redacted]w[redacted]d admitted for suspected chorio  Subjective: Epidural is working, feeling well.  No vomiting/diarrhea since yesterday.    Objective: BP 98/41  Pulse 69  Temp 99 F (37.2 C) (Oral)  Resp 18  Ht 5\' 4"  (1.626 m)  Wt 64.864 kg (143 lb)  BMI 24.55 kg/m2  SpO2 98%  LMP 10/13/2011  Breastfeeding? Unknown      FHT:  FHR: 135 bpm, variability: moderate,  accelerations:  Present,  decelerations:  Absent UC:   regular, every 2-3 minutes SVE:  6/70/ballotable @1015  by Susan Hayes Labs: Lab Results  Component Value Date   WBC 23.5* 06/28/2012   HGB 8.3* 06/28/2012   HCT 25.0* 06/28/2012   MCV 81.2 06/28/2012   PLT 141* 06/28/2012    Assessment / Plan: IOL for chorio, on pitocin  Labor: Progressing on Pitocin, will continue to increase then AROM once head is better applied to cervix Preeclampsia:  n/a Fetal Wellbeing:  Category I Pain Control:  Epidural I/D:  Amp/gent for presumed chorio Anticipated MOD:  NSVD  Susan Hayes 06/29/2012, 10:17 AM

## 2012-06-29 NOTE — Anesthesia Preprocedure Evaluation (Signed)
Anesthesia Evaluation  Patient identified by MRN, date of birth, ID band Patient awake    Reviewed: Allergy & Precautions, H&P , NPO status , Patient's Chart, lab work & pertinent test results, reviewed documented beta blocker date and time   History of Anesthesia Complications Negative for: history of anesthetic complications  Airway Mallampati: II TM Distance: >3 FB Neck ROM: full    Dental  (+) Teeth Intact   Pulmonary neg pulmonary ROS,  breath sounds clear to auscultation        Cardiovascular negative cardio ROS  Rhythm:regular Rate:Normal     Neuro/Psych negative neurological ROS  negative psych ROS   GI/Hepatic negative GI ROS, Neg liver ROS,   Endo/Other  negative endocrine ROS  Renal/GU negative Renal ROS     Musculoskeletal   Abdominal   Peds  Hematology negative hematology ROS (+)   Anesthesia Other Findings Admitted with fever and nausea/vomiting  Reproductive/Obstetrics (+) Pregnancy                           Anesthesia Physical Anesthesia Plan  ASA: II  Anesthesia Plan: Epidural   Post-op Pain Management:    Induction:   Airway Management Planned:   Additional Equipment:   Intra-op Plan:   Post-operative Plan:   Informed Consent: I have reviewed the patients History and Physical, chart, labs and discussed the procedure including the risks, benefits and alternatives for the proposed anesthesia with the patient or authorized representative who has indicated his/her understanding and acceptance.     Plan Discussed with:   Anesthesia Plan Comments:         Anesthesia Quick Evaluation

## 2012-06-29 NOTE — Progress Notes (Signed)
Susan Hayes is a 36 y.o. [phone] at [redacted]w[redacted]d with suspected chorio Subjective: Comfortable w/epidural, feels well  Objective: BP 108/57  Pulse 86  Temp 99.9 F (37.7 C) (Axillary)  Resp 20  Ht 5\' 4"  (1.626 m)  Wt 64.864 kg (143 lb)  BMI 24.55 kg/m2  SpO2 98%  LMP 10/13/2011  Breastfeeding? Unknown      FHT:  FHR: 165 bpm, variability: moderate,  accelerations:  Present,  decelerations:  Absent UC:   regular, every 2-5 minutes SVE:   Dilation: 5.5 Effacement (%): 70 Station: -2 Exam by:: e.foley,rn  Labs: Lab Results  Component Value Date   WBC 23.5* 06/28/2012   HGB 8.3* 06/28/2012   HCT 25.0* 06/28/2012   MCV 81.2 06/28/2012   PLT 141* 06/28/2012    Assessment / Plan: IUP@[redacted]w[redacted]d  Suspected chorio Protracted labor  Continue Pitocin augmentation and abx, anticipate SVD.  Lawernce Pitts 06/29/2012, 7:24 AM

## 2012-06-29 NOTE — Progress Notes (Signed)
Susan Hayes is a 36 y.o. [phone] at [redacted]w[redacted]d here for suspected chorio Subjective: Comfortable w/epidural  Objective: BP 86/49  Pulse 87  Temp 98 F (36.7 C) (Axillary)  Resp 18  Ht 5\' 4"  (1.626 m)  Wt 64.864 kg (143 lb)  BMI 24.55 kg/m2  SpO2 98%  LMP 10/13/2011  Breastfeeding? Unknown      FHT:  FHR: 155 bpm, variability: moderate,  accelerations:  Present,  decelerations:  Absent UC:   regular, every 2-5 minutes  SVE:   5.5/70/-2  Labs: Lab Results  Component Value Date   WBC 23.5* 06/28/2012   HGB 8.3* 06/28/2012   HCT 25.0* 06/28/2012   MCV 81.2 06/28/2012   PLT 141* 06/28/2012    Assessment / Plan: IUP@[redacted]w[redacted]d  Suspected chorio  Pitocin for protracted labor, continue abx, anticipate SVD.  Lawernce Pitts 06/29/2012, 3:50 AM

## 2012-06-29 NOTE — H&P (Signed)
Pt with suspected chorioamnionitis.  With elevated WBC, abdominal tenderness and fetal tachycardia.  Currently with uterine contractions and cervical change.  Will admit, begin Amp and Gent and proceed to delivery.  Newel Oien L. Harraway-Smith, M.D., Evern Core

## 2012-06-29 NOTE — Anesthesia Postprocedure Evaluation (Signed)
  Anesthesia Post-op Note  Patient: Susan Hayes  Procedure(s) Performed: * No procedures listed *  Patient Location: Mother/Baby  Anesthesia Type: Epidural  Level of Consciousness: awake, alert  and oriented  Airway and Oxygen Therapy: Patient Spontanous Breathing  Post-op Pain: none  Post-op Assessment: Post-op Vital signs reviewed, Patient's Cardiovascular Status Stable, No headache, No backache, No residual numbness and No residual motor weakness  Post-op Vital Signs: Reviewed and stable  Complications: No apparent anesthesia complications

## 2012-06-30 LAB — CBC
MCH: 26.7 pg (ref 26.0–34.0)
MCHC: 32.5 g/dL (ref 30.0–36.0)
MCV: 82.1 fL (ref 78.0–100.0)
Platelets: 153 10*3/uL (ref 150–400)
RDW: 15.3 % (ref 11.5–15.5)

## 2012-06-30 NOTE — Progress Notes (Signed)
Post Partum Day 1 Subjective: no complaints, up ad lib, voiding, tolerating PO and + flatus  Pt denies dizziness, fatigue, h/a, dysuria.  Pain well controlled with medications.  Objective: Blood pressure 90/66, pulse 70, temperature 97 F (36.1 C), temperature source Oral, resp. rate 18, height 5\' 4"  (1.626 m), weight 64.864 kg (143 lb), last menstrual period 10/13/2011, SpO2 98.00%, unknown if currently breastfeeding.  Physical Exam:  General: alert, cooperative and no distress Lochia: appropriate, small amount Uterine Fundus: firm, -2 Incision: N/A DVT Evaluation: No evidence of DVT seen on physical exam. Negative Homan's sign. No cords or calf tenderness. No significant calf/ankle edema.   Basename 06/30/12 0550 06/28/12 2045  HGB 7.9* 8.3*  HCT 24.3* 25.0*   WBC 23.5 prior to delivery >> 15.8 postpartum   Assessment/Plan: Plan for discharge tomorrow Breastfeeding Infant rooming in Undecided regarding contraception   LOS: 2 days   LEFTWICH-KIRBY, LISA 06/30/2012, 6:48 AM

## 2012-07-01 MED ORDER — FERROUS SULFATE 325 (65 FE) MG PO TABS: 325.0000 mg | ORAL_TABLET | Freq: Two times a day (BID) | ORAL | Status: DC

## 2012-07-01 MED ORDER — IBUPROFEN 600 MG PO TABS: 600.0000 mg | ORAL_TABLET | Freq: Four times a day (QID) | ORAL | Status: AC

## 2012-07-01 NOTE — Discharge Planning (Signed)
Obstetric Discharge Summary Reason for Admission: onset of labor and supsected chorioamnioitis Prenatal Procedures: none Intrapartum Procedures: spontaneous vaginal delivery Postpartum Procedures: antibiotics (AMP/Gent) Complications-Operative and Postpartum: Postpartum fever 102 Hemoglobin  Date Value Range Status  06/30/2012 7.9* 12.0 - 15.0 g/dL Final     HCT  Date Value Range Status  06/30/2012 24.3* 36.0 - 46.0 % Final    Physical Exam:  General: alert, cooperative, appears stated age and no distress Lochia: appropriate Uterine Fundus: firm Incision: n/a DVT Evaluation: No evidence of DVT seen on physical exam. Unable to palate pedal pulse, but good capillary (<1sec) refill and both feet are warm. No pain or swelling No cords or calf tenderness. CVS exam: normal rate, regular rhythm, normal S1, S2, no murmurs, rubs, clicks or gallops. Lungs - Normal respiratory effort, chest expands symmetrically. Lungs are clear to auscultation, no crackles or wheezes. Abdominal: mild bilaterally lower quadrant tenderness. No guarding, masses, hepatospelnomegly, bruits. +bowel sounds.    Discharge Diagnoses: Term Pregnancy-delivered and Amnionitis  Discharge Information: Date: 07/01/2012 Activity: pelvic rest Diet: routine Medications: Ibuprofen, Colace and Iron Condition: stable Instructions: refer to practice specific booklet Discharge to: home   Newborn Data: Live born female  Birth Weight: 6 lb 15.5 oz (3161 g) APGAR: 9, 9  Home with mother.  Doran Heater 07/01/2012, 7:24 AM  I have seen and examined this patient and I agree with the above. Cam Hai 12:40 AM 07/04/2012

## 2012-07-03 NOTE — Discharge Summary (Signed)
  Obstetric Discharge Summary  Reason for Admission: onset of labor and supsected chorioamnioitis  Prenatal Procedures: none  Intrapartum Procedures: spontaneous vaginal delivery  Postpartum Procedures: antibiotics (AMP/Gent)  Complications-Operative and Postpartum: Postpartum fever 102   Hemoglobin   Date  Value  Range  Status   06/30/2012  7.9*  12.0 - 15.0 g/dL  Final      HCT   Date  Value  Range  Status   06/30/2012  24.3*  36.0 - 46.0 %  Final    Susan Hayes presented on 9/9 with regular painful ctx. She delivered a viable female via SVD later that day and deliver was complicated by placental abruption. Immediately post partum she spiked a fever to 102 and there was concern for chorioamnionitis. She was treated with amp and gent  With resolution. No further concerns. PP hgb 7.9 and she was discharged home ppd #2 on iron.  She plans to follow up in clinic at 6 weeks.   Physical Exam:  General: alert, cooperative, appears stated age and no distress  Lochia: appropriate  Uterine Fundus: firm  Incision: n/a  DVT Evaluation: No evidence of DVT seen on physical exam. Unable to palate pedal pulse, but good capillary (<1sec) refill and both feet are warm. No pain or swelling  No cords or calf tenderness.  CVS exam: normal rate, regular rhythm, normal S1, S2, no murmurs, rubs, clicks or gallops.  Lungs - Normal respiratory effort, chest expands symmetrically. Lungs are clear to auscultation, no crackles or wheezes.  Abdominal: mild bilaterally lower quadrant tenderness. No guarding, masses, hepatospelnomegly, bruits. +bowel sounds.    Discharge Diagnoses: Term Pregnancy-delivered and Amnionitis    Discharge Information:  Date: 07/01/2012  Activity: pelvic rest  Diet: routine  Medications: Ibuprofen, Colace and Iron  Condition: stable  Instructions: refer to practice specific booklet  Discharge to: home    Newborn Data:  Live born female  Birth Weight: 6 lb 15.5 oz (3161 g)    APGAR: 9, 9  Home with mother.   Rulon Abide MD 07/02/12

## 2012-07-03 NOTE — Progress Notes (Signed)
I examined pt and agree with documentation above and nurse midwife student plan of care. MUHAMMAD,Rebekah Zackery  

## 2012-07-03 NOTE — Progress Notes (Signed)
I examined pt and agree with documentation above and nurse midwife student plan of care. Susan Hayes,Susan Hayes  

## 2012-07-03 NOTE — Discharge Summary (Signed)
Seen and agree with note. Wynelle Bourgeois

## 2012-07-03 NOTE — Progress Notes (Signed)
I examined pt and agree with documentation above and nurse midwife student plan of care. MUHAMMAD,WALIDAH  

## 2012-07-29 ENCOUNTER — Telehealth: Payer: Self-pay | Admitting: *Deleted

## 2012-07-29 MED ORDER — METOCLOPRAMIDE HCL 10 MG PO TABS: 10.0000 mg | ORAL_TABLET | Freq: Four times a day (QID) | ORAL | Status: DC

## 2012-07-29 NOTE — Telephone Encounter (Signed)
Pt called stating that with all of her pregnancies her milk supply is low.  She has used Reglan before and wants to know if it can be called into the pharmacy.  Spoke with Dr Penne Lash and she OK'd RX to be called in.  Pt warned that it may make her very tired and if she has any signs of depression to call the office.

## 2012-08-27 ENCOUNTER — Ambulatory Visit: Payer: Self-pay | Admitting: Obstetrics & Gynecology

## 2014-08-22 ENCOUNTER — Encounter (HOSPITAL_COMMUNITY): Payer: Self-pay | Admitting: *Deleted

## 2014-08-25 ENCOUNTER — Ambulatory Visit (HOSPITAL_BASED_OUTPATIENT_CLINIC_OR_DEPARTMENT_OTHER)
Admission: RE | Admit: 2014-08-25 | Discharge: 2014-08-25 | Disposition: A | Discharge status: Home / Self Care | Payer: Managed Care, Other (non HMO) | Source: Ambulatory Visit | Attending: Family Medicine | Admitting: Family Medicine

## 2014-08-25 ENCOUNTER — Other Ambulatory Visit (HOSPITAL_BASED_OUTPATIENT_CLINIC_OR_DEPARTMENT_OTHER): Payer: Self-pay | Admitting: Family Medicine

## 2014-08-25 DIAGNOSIS — R109 Unspecified abdominal pain: Secondary | ICD-10-CM | POA: Insufficient documentation

## 2014-08-25 DIAGNOSIS — R6883 Chills (without fever): Secondary | ICD-10-CM | POA: Insufficient documentation

## 2014-08-25 DIAGNOSIS — R509 Fever, unspecified: Secondary | ICD-10-CM

## 2014-08-25 IMAGING — CT CT RENAL STONE PROTOCOL
2 of 4 series · 16 of 46 positions shown, 18 images · non-contrast
Comparison: None.

CLINICAL DATA: Left sided abdominal pain and left flank pain.
Fever. Chills. Acute onset.

EXAM:
CT ABDOMEN AND PELVIS WITHOUT CONTRAST
TECHNIQUE: Multidetector CT imaging of the abdomen and pelvis was performed
following the standard protocol without IV contrast.

[Series 2: renal stone < 200 lbs 5.0 b31f · axial · 0.75mm/px · z∈[-479,-84]mm · 13 of 87 slices shown, 15 images]
[im 4/87  soft-tissue]
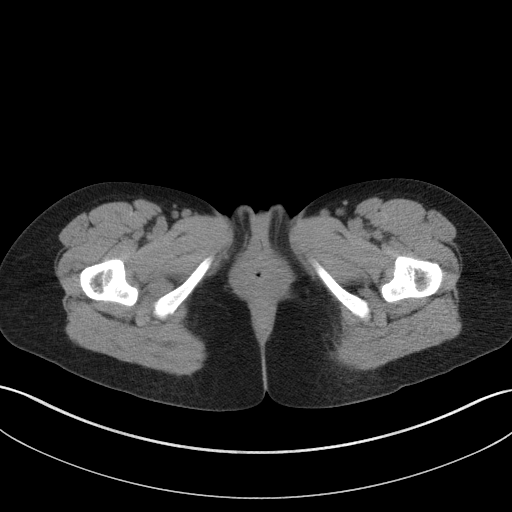
[im 4/87  bone]
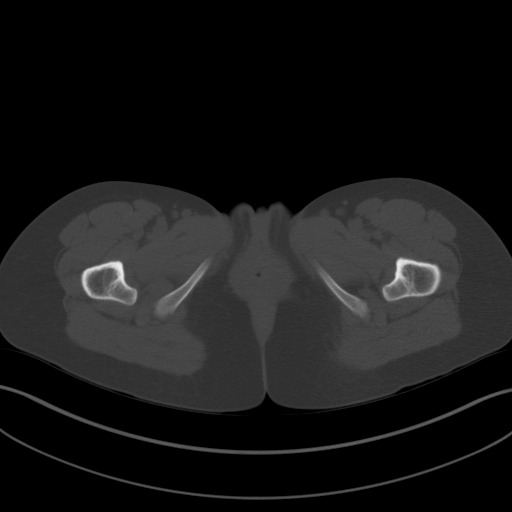
[im 11/87  soft-tissue]
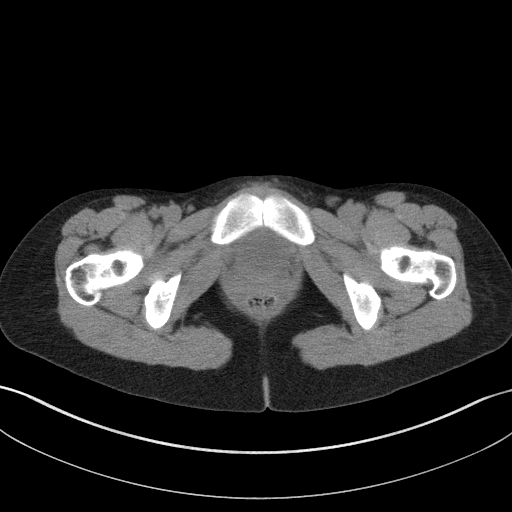
[im 18/87  soft-tissue]
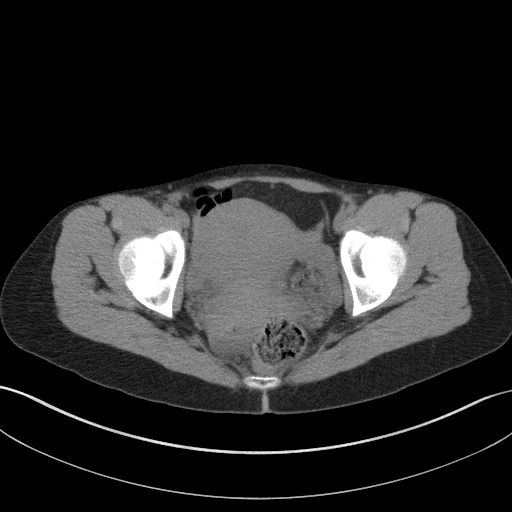
[im 26/87  soft-tissue]
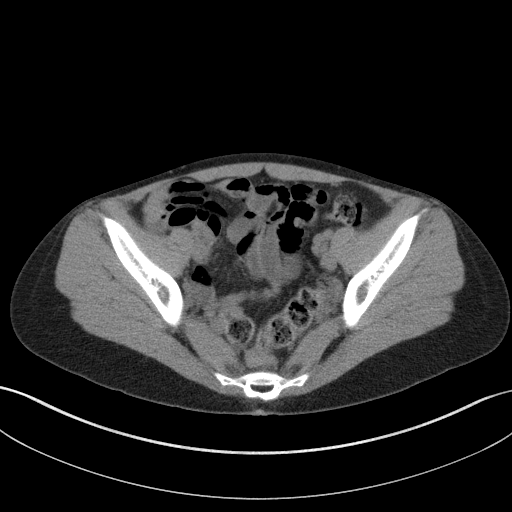
[im 29/87  soft-tissue]
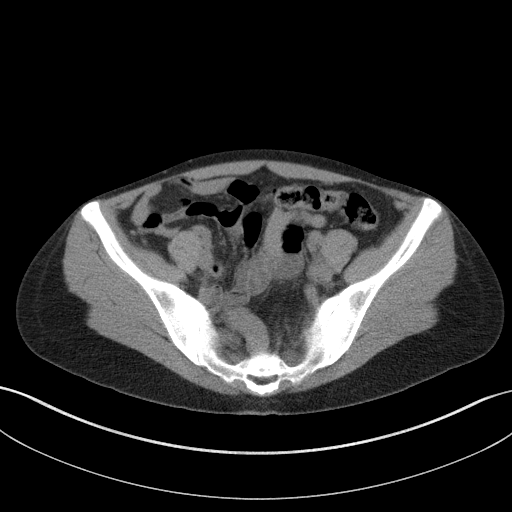
[im 36/87  soft-tissue]
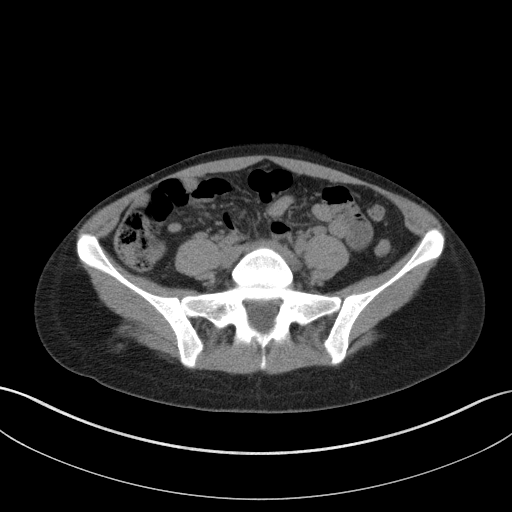
[im 44/87  soft-tissue]
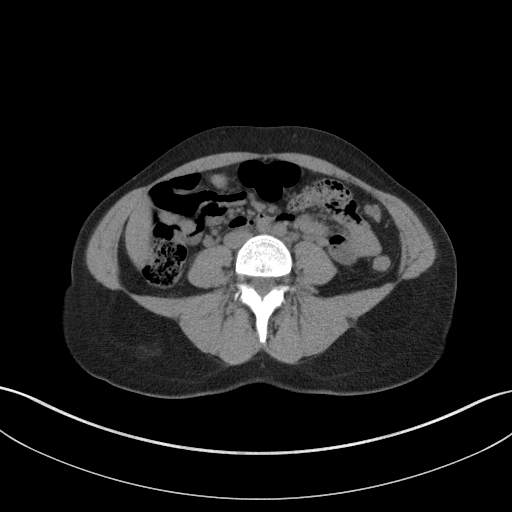
[im 51/87  soft-tissue]
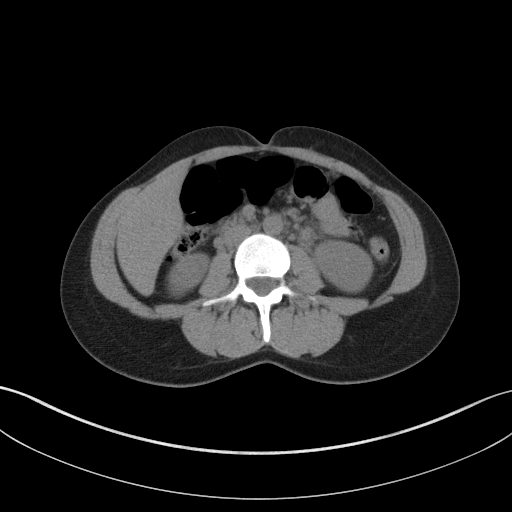
[im 58/87  soft-tissue]
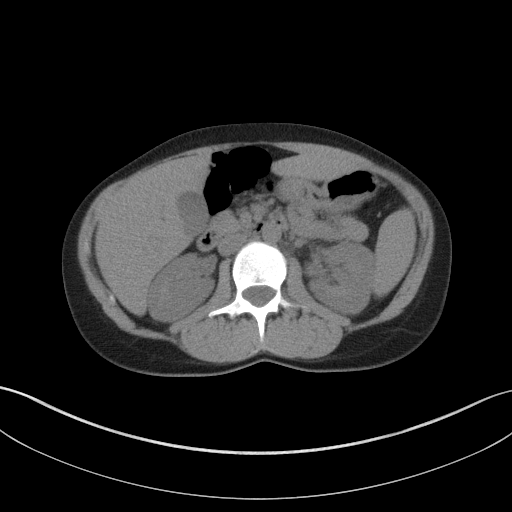
[im 58/87  bone]
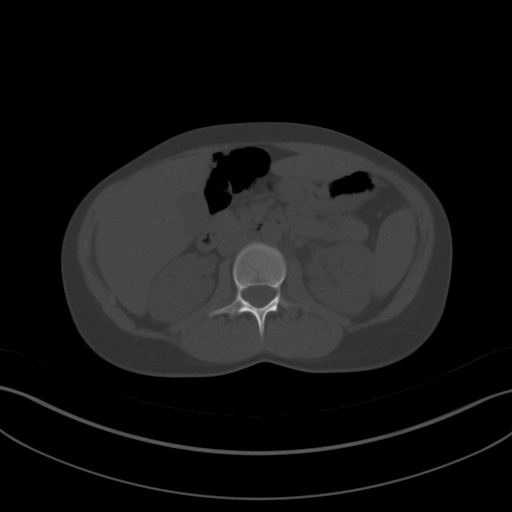
[im 61/87  soft-tissue]
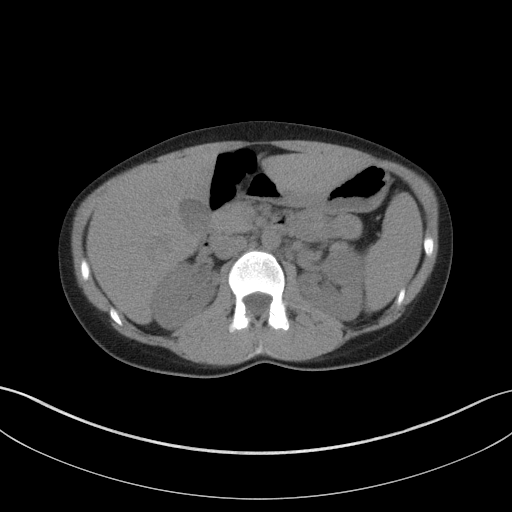
[im 69/87  soft-tissue]
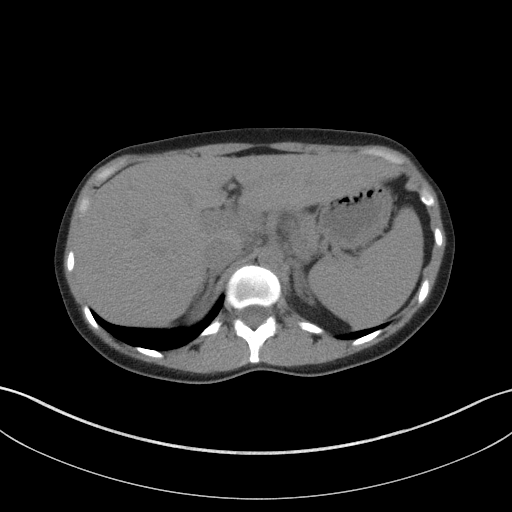
[im 76/87  soft-tissue]
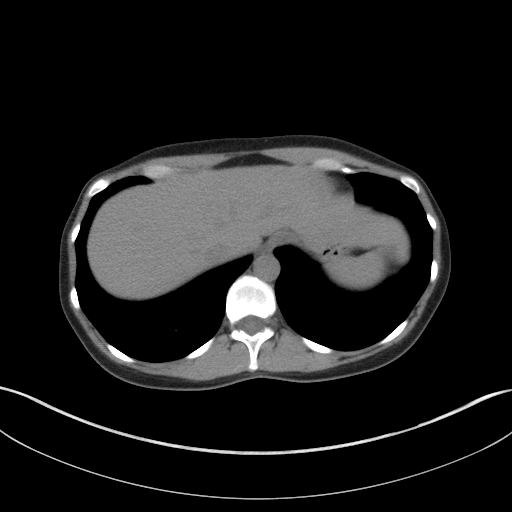
[im 83/87  soft-tissue]
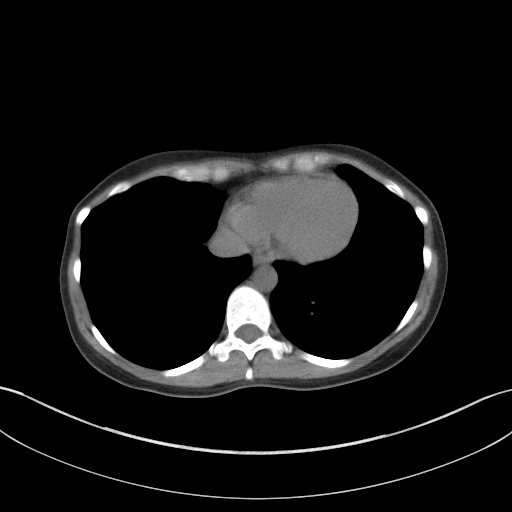

[Series 5: renal stone 3.0 coronal · coronal · 0.70mm/px · 3 of 69 slices shown]
[im 23/69  soft-tissue]
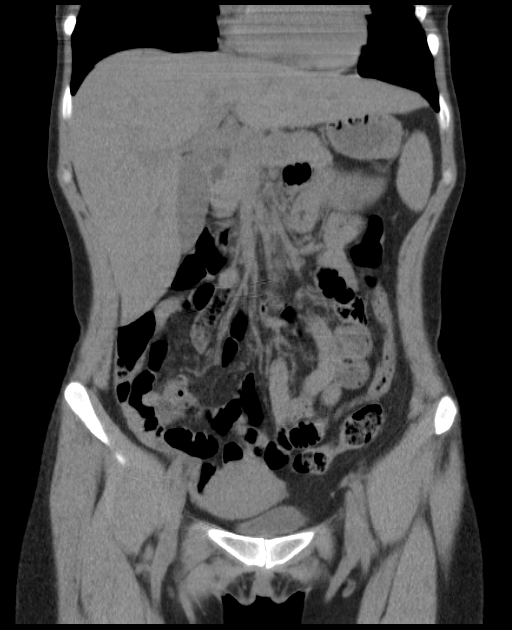
[im 31/69  soft-tissue]
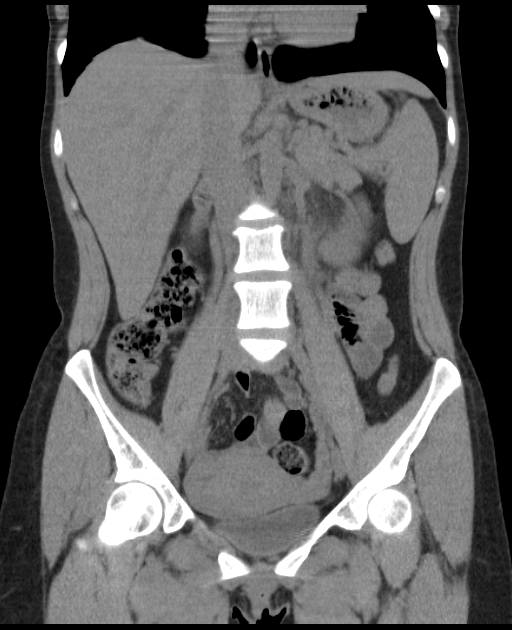
[im 38/69  soft-tissue]
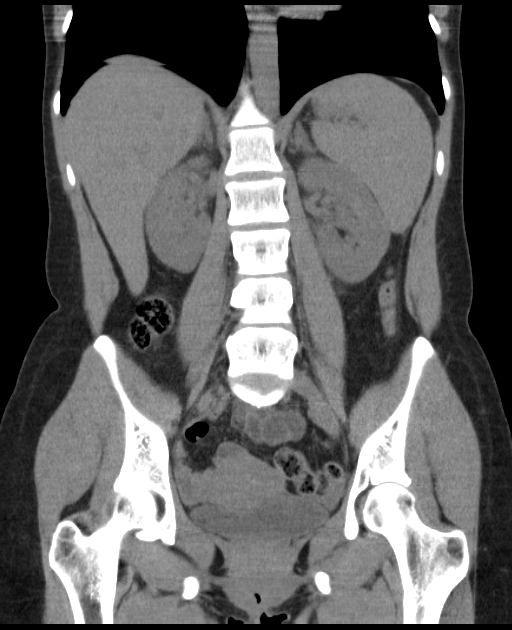

[16 of 46 positions shown; findings below may reference images not displayed]

FINDINGS: No evidence of urinary tract calculi or obstruction on either side.
Within the limits of the unenhanced technique, no focal parenchymal
abnormality involving either kidney. Hyperdense renal tubules.

Approximate 1.1 cm simple cyst in the anterior segment right lobe of
liver; no significant abnormalities involving the liver. Normal
unenhanced appearance of the, spleen, pancreas, adrenal glands, and
gallbladder. No biliary ductal dilation. No visible atherosclerosis.
No significant lymphadenopathy.

Stomach decompressed and unremarkable. Normal-appearing small bowel
and colon. Expected stool burden throughout the colon.
Normal-appearing decompressed appendix in the right upper pelvis.

Normal-appearing uterus and ovaries. Small amount of free fluid in
the cul-de-sac. Urinary bladder decompressed and unremarkable.

Bone window images unremarkable apart from a Schmorl's node in the
upper endplate of L3. Visualized lung bases clear. Heart size
normal.
IMPRESSION: 1. No evidence of urinary tract calculi or obstruction on either
side.
2. Hyperdense renal tubules which can be seen in patients with renal
tubular ectasia.
3. Approximate 1 cm simple cyst in the anterior segment right lobe
of liver.
4. No acute abnormalities involving the abdomen or pelvis.

## 2014-09-30 ENCOUNTER — Other Ambulatory Visit (HOSPITAL_COMMUNITY): Payer: Self-pay | Admitting: Obstetrics

## 2014-09-30 DIAGNOSIS — Z3141 Encounter for fertility testing: Secondary | ICD-10-CM

## 2014-10-03 ENCOUNTER — Ambulatory Visit (HOSPITAL_COMMUNITY)
Admission: RE | Admit: 2014-10-03 | Discharge: 2014-10-03 | Disposition: A | Discharge status: Home / Self Care | Payer: Managed Care, Other (non HMO) | Source: Ambulatory Visit | Attending: Obstetrics | Admitting: Obstetrics

## 2014-10-03 DIAGNOSIS — N979 Female infertility, unspecified: Secondary | ICD-10-CM | POA: Diagnosis present

## 2014-10-03 DIAGNOSIS — Z3141 Encounter for fertility testing: Secondary | ICD-10-CM

## 2014-10-03 IMAGING — RF DG HYSTEROGRAM
5 series · 5 of 5 positions shown · IV contrast (omnipaque)
Comparison: None.

FLUOROSCOPY TIME:  1 min 12 seconds

CLINICAL DATA: Infertility.

EXAM:
HYSTEROSALPINGOGRAM
TECHNIQUE: Following cleansing of the cervix and vagina with Betadine solution,
a hysterosalpingogram was performed using a 5-French
hysterosalpingogram catheter and Omnipaque 300 contrast. The patient
tolerated the examination without difficulty.

[Series 1: run · 1 of 1 slices shown (1 of 5)]
[im 1/1]
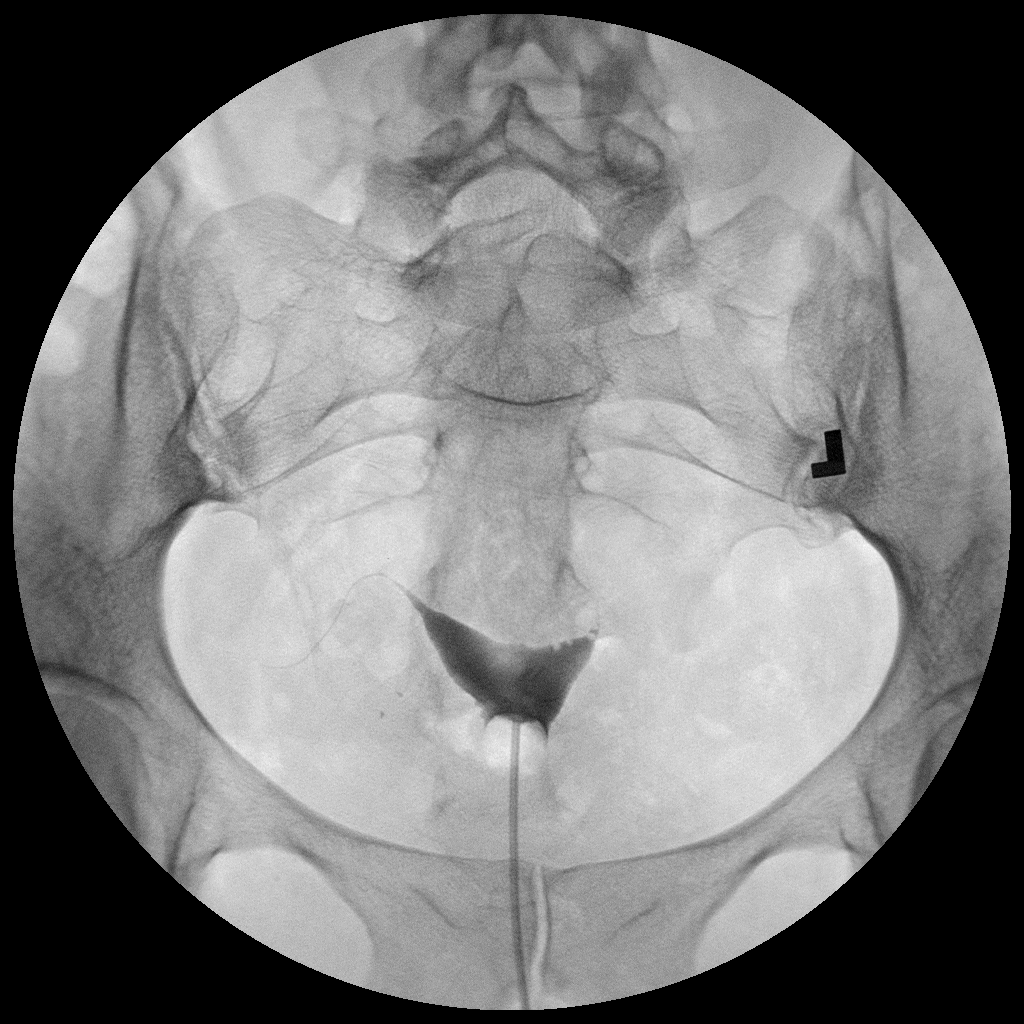

[Series 2: run · 1 of 1 slices shown (2 of 5)]
[im 1/1]
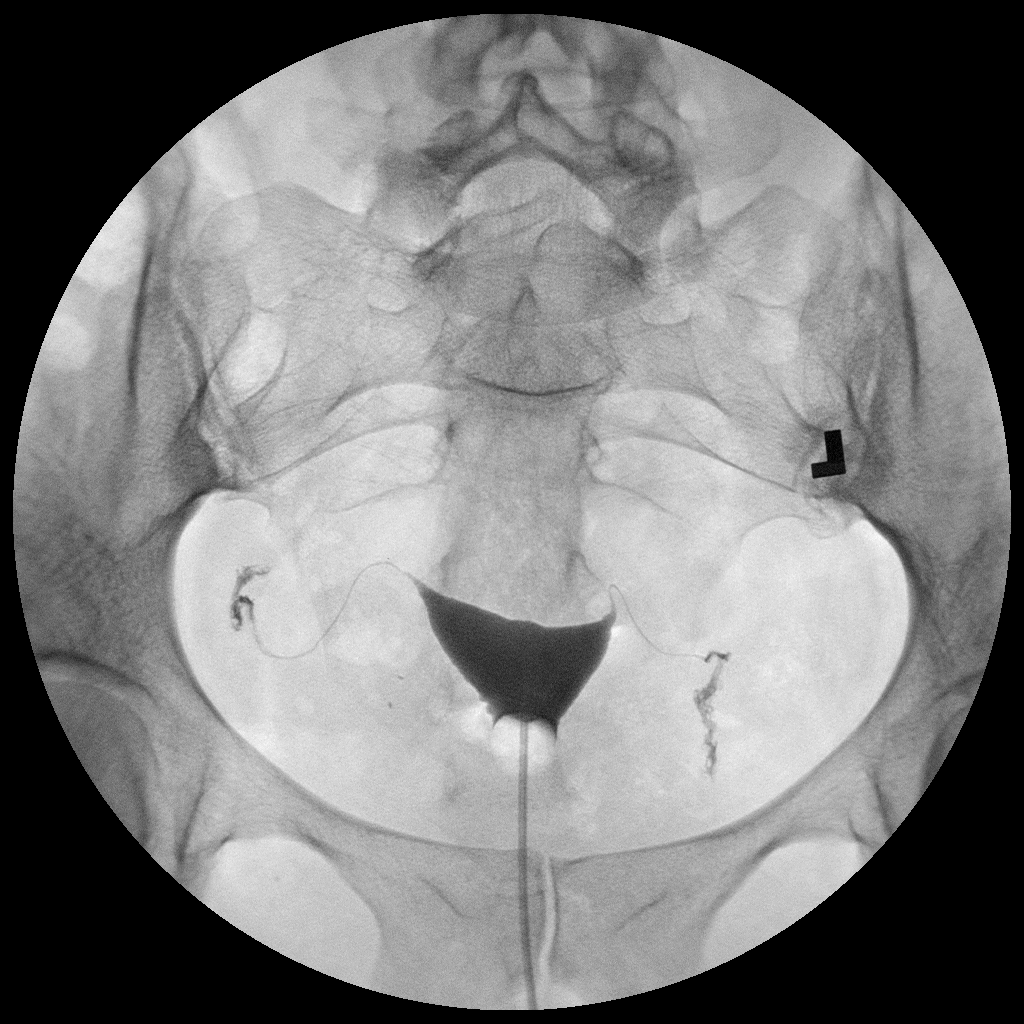

[Series 3: run · 1 of 1 slices shown (3 of 5)]
[im 1/1]
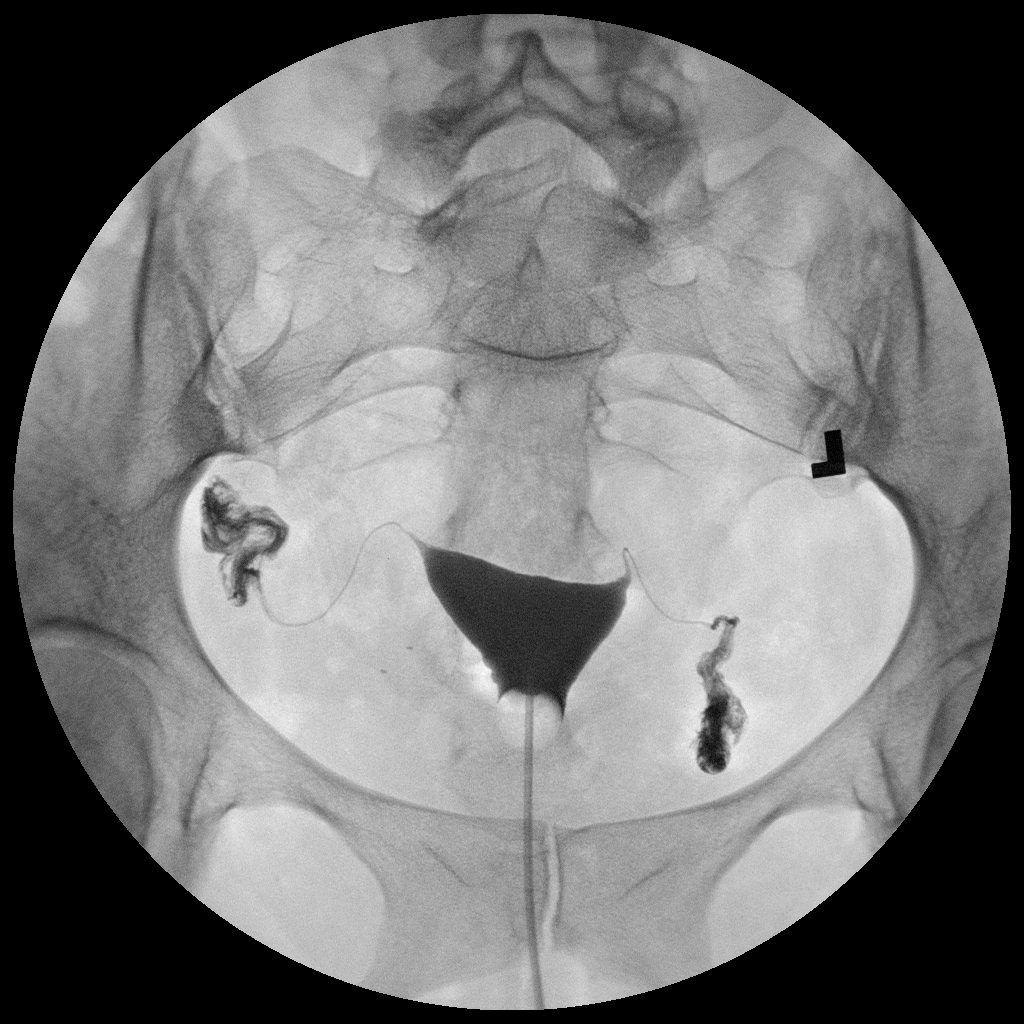

[Series 4: run · 1 of 1 slices shown (4 of 5)]
[im 1/1]
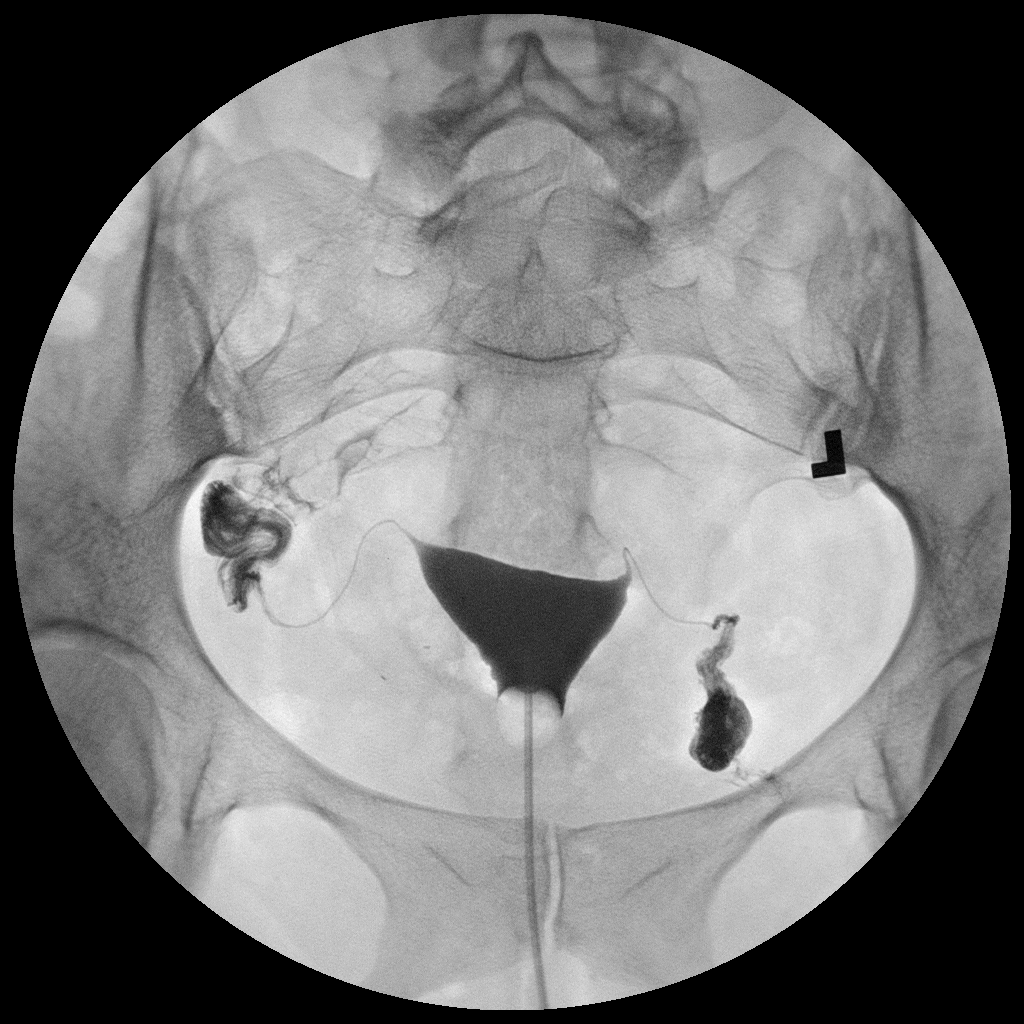

[Series 5: run · 1 of 1 slices shown (5 of 5)]
[im 1/1]
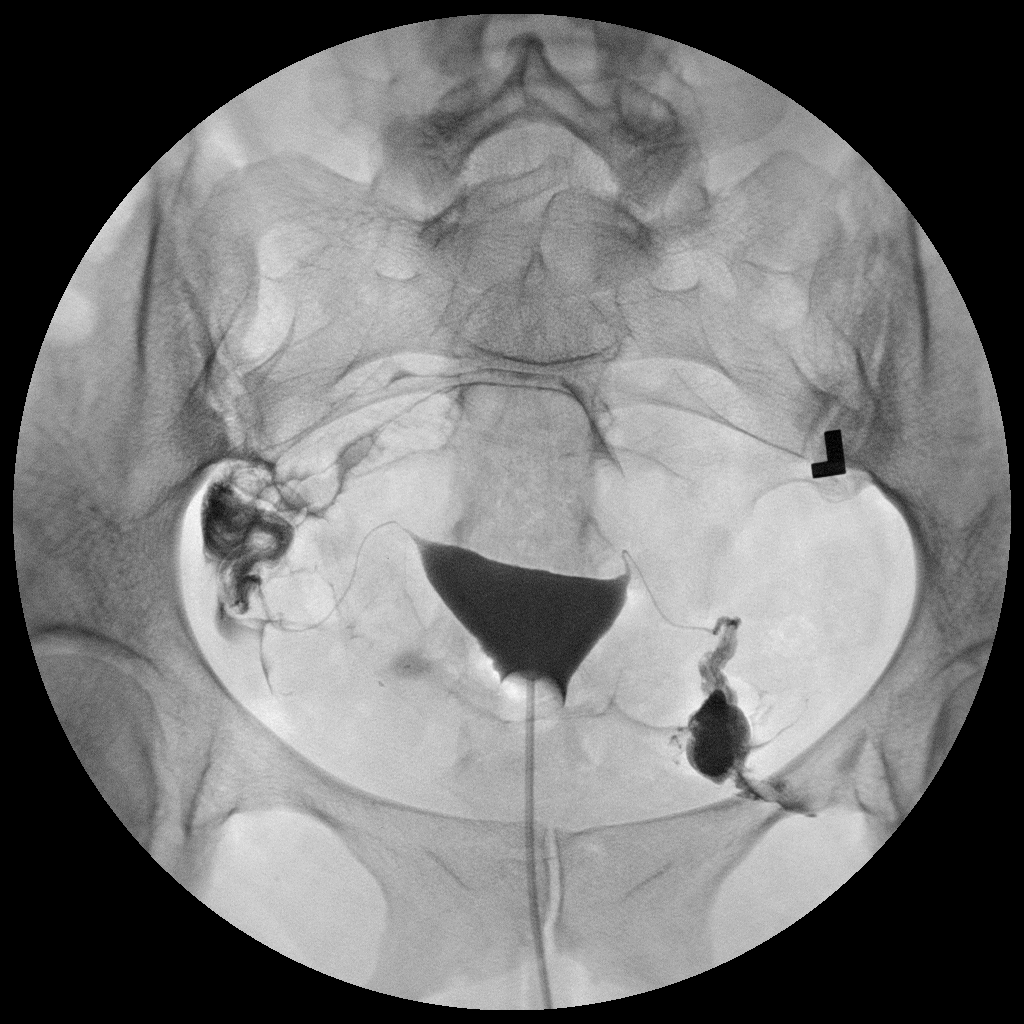

[5 of 5 positions shown; findings below may reference images not displayed]

FINDINGS: Normal appearance of the endometrial cavity. Normal appearance of
the right fallopian tube with prompt spillage. Delayed spilling from
the left fallopian tube with a loculated appearance.
IMPRESSION: Free spillage in normal appearance of the right fallopian tube.
Delayed spillage with loculated appearance from the left fallopian
tube suggesting adhesions.

## 2014-10-03 MED ORDER — IOHEXOL 300 MG/ML  SOLN
20.0000 mL | Freq: Once | INTRAMUSCULAR | Status: AC | PRN
  Administered 2014-10-03: 20 mL

## 2014-10-21 NOTE — L&D Delivery Note (Signed)
Pt was admitted with SROM. She progrossed along to a nl labor curve. She pushed one time and had a SVD of one live viable white female infant over an intact perineum in the ROA position. Placenta- S/I. EBL-400 cc. Baby to NBN.

## 2015-01-02 ENCOUNTER — Encounter: Payer: Self-pay | Admitting: Advanced Practice Midwife

## 2015-01-02 ENCOUNTER — Ambulatory Visit (INDEPENDENT_AMBULATORY_CARE_PROVIDER_SITE_OTHER): Payer: 59 | Admitting: Advanced Practice Midwife

## 2015-01-02 VITALS — BP 92/54 | HR 66 | Temp 98.1°F | Wt 122.0 lb

## 2015-01-02 DIAGNOSIS — O09521 Supervision of elderly multigravida, first trimester: Secondary | ICD-10-CM

## 2015-01-02 DIAGNOSIS — J988 Other specified respiratory disorders: Secondary | ICD-10-CM

## 2015-01-02 DIAGNOSIS — Z3481 Encounter for supervision of other normal pregnancy, first trimester: Secondary | ICD-10-CM

## 2015-01-02 DIAGNOSIS — Z3491 Encounter for supervision of normal pregnancy, unspecified, first trimester: Secondary | ICD-10-CM

## 2015-01-02 DIAGNOSIS — B349 Viral infection, unspecified: Secondary | ICD-10-CM

## 2015-01-02 DIAGNOSIS — B9789 Other viral agents as the cause of diseases classified elsewhere: Secondary | ICD-10-CM

## 2015-01-02 DIAGNOSIS — O09529 Supervision of elderly multigravida, unspecified trimester: Secondary | ICD-10-CM | POA: Insufficient documentation

## 2015-01-02 LAB — US OB COMP LESS 14 WKS

## 2015-01-02 MED ORDER — METOCLOPRAMIDE HCL 10 MG PO TABS
10.0000 mg | ORAL_TABLET | Freq: Four times a day (QID) | ORAL | Status: DC | PRN
Start: 2015-01-02 — End: 2015-08-20

## 2015-01-02 NOTE — Patient Instructions (Addendum)
If no improvement in respiratory symptoms in 2-3 days or if fevers return, shortness of breath or worsening cough occur, come to [facility] for chest X-ray and antibiotics.  First Trimester of Pregnancy The first trimester of pregnancy is from week 1 until the end of week 12 (months 1 through 3). A week after a sperm fertilizes an egg, the egg will implant on the wall of the uterus. This embryo will begin to develop into a baby. Genes from you and your partner are forming the baby. The female genes determine whether the baby is a boy or a girl. At 6-8 weeks, the eyes and face are formed, and the heartbeat can be seen on ultrasound. At the end of 12 weeks, all the baby's organs are formed.  Now that you are pregnant, you will want to do everything you can to have a healthy baby. Two of the most important things are to get good prenatal care and to follow your health care provider's instructions. Prenatal care is all the medical care you receive before the baby's birth. This care will help prevent, find, and treat any problems during the pregnancy and childbirth. BODY CHANGES Your body goes through many changes during pregnancy. The changes vary from woman to woman.   You may gain or lose a couple of pounds at first.  You may feel sick to your stomach (nauseous) and throw up (vomit). If the vomiting is uncontrollable, call your health care provider.  You may tire easily.  You may develop headaches that can be relieved by medicines approved by your health care provider.  You may urinate more often. Painful urination may mean you have a bladder infection.  You may develop heartburn as a result of your pregnancy.  You may develop constipation because certain hormones are causing the muscles that push waste through your intestines to slow down.  You may develop hemorrhoids or swollen, bulging veins (varicose veins).  Your breasts may begin to grow larger and become tender. Your nipples may  stick out more, and the tissue that surrounds them (areola) may become darker.  Your gums may bleed and may be sensitive to brushing and flossing.  Dark spots or blotches (chloasma, mask of pregnancy) may develop on your face. This will likely fade after the baby is born.  Your menstrual periods will stop.  You may have a loss of appetite.  You may develop cravings for certain kinds of food.  You may have changes in your emotions from day to day, such as being excited to be pregnant or being concerned that something may go wrong with the pregnancy and baby.  You may have more vivid and strange dreams.  You may have changes in your hair. These can include thickening of your hair, rapid growth, and changes in texture. Some women also have hair loss during or after pregnancy, or hair that feels dry or thin. Your hair will most likely return to normal after your baby is born. WHAT TO EXPECT AT YOUR PRENATAL VISITS During a routine prenatal visit:  You will be weighed to make sure you and the baby are growing normally.  Your blood pressure will be taken.  Your abdomen will be measured to track your baby's growth.  The fetal heartbeat will be listened to starting around week 10 or 12 of your pregnancy.  Test results from any previous visits will be discussed. Your health care provider may ask you:  How you are feeling.  If you are  feeling the baby move.  If you have had any abnormal symptoms, such as leaking fluid, bleeding, severe headaches, or abdominal cramping.  If you have any questions. Other tests that may be performed during your first trimester include:  Blood tests to find your blood type and to check for the presence of any previous infections. They will also be used to check for low iron levels (anemia) and Rh antibodies. Later in the pregnancy, blood tests for diabetes will be done along with other tests if problems develop.  Urine tests to check for infections,  diabetes, or protein in the urine.  An ultrasound to confirm the proper growth and development of the baby.  An amniocentesis to check for possible genetic problems.  Fetal screens for spina bifida and Down syndrome.  You may need other tests to make sure you and the baby are doing well. HOME CARE INSTRUCTIONS  Medicines  Follow your health care provider's instructions regarding medicine use. Specific medicines may be either safe or unsafe to take during pregnancy.  Take your prenatal vitamins as directed.  If you develop constipation, try taking a stool softener if your health care provider approves. Diet  Eat regular, well-balanced meals. Choose a variety of foods, such as meat or vegetable-based protein, fish, milk and low-fat dairy products, vegetables, fruits, and whole grain breads and cereals. Your health care provider will help you determine the amount of weight gain that is right for you.  Avoid raw meat and uncooked cheese. These carry germs that can cause birth defects in the baby.  Eating four or five small meals rather than three large meals a day may help relieve nausea and vomiting. If you start to feel nauseous, eating a few soda crackers can be helpful. Drinking liquids between meals instead of during meals also seems to help nausea and vomiting.  If you develop constipation, eat more high-fiber foods, such as fresh vegetables or fruit and whole grains. Drink enough fluids to keep your urine clear or pale yellow. Activity and Exercise  Exercise only as directed by your health care provider. Exercising will help you:  Control your weight.  Stay in shape.  Be prepared for labor and delivery.  Experiencing pain or cramping in the lower abdomen or low back is a good sign that you should stop exercising. Check with your health care provider before continuing normal exercises.  Try to avoid standing for long periods of time. Move your legs often if you must stand in  one place for a long time.  Avoid heavy lifting.  Wear low-heeled shoes, and practice good posture.  You may continue to have sex unless your health care provider directs you otherwise. Relief of Pain or Discomfort  Wear a good support bra for breast tenderness.   Take warm sitz baths to soothe any pain or discomfort caused by hemorrhoids. Use hemorrhoid cream if your health care provider approves.   Rest with your legs elevated if you have leg cramps or low back pain.  If you develop varicose veins in your legs, wear support hose. Elevate your feet for 15 minutes, 3-4 times a day. Limit salt in your diet. Prenatal Care  Schedule your prenatal visits by the twelfth week of pregnancy. They are usually scheduled monthly at first, then more often in the last 2 months before delivery.  Write down your questions. Take them to your prenatal visits.  Keep all your prenatal visits as directed by your health care provider. Safety  Wear  your seat belt at all times when driving.  Make a list of emergency phone numbers, including numbers for family, friends, the hospital, and police and fire departments. General Tips  Ask your health care provider for a referral to a local prenatal education class. Begin classes no later than at the beginning of month 6 of your pregnancy.  Ask for help if you have counseling or nutritional needs during pregnancy. Your health care provider can offer advice or refer you to specialists for help with various needs.  Do not use hot tubs, steam rooms, or saunas.  Do not douche or use tampons or scented sanitary pads.  Do not cross your legs for long periods of time.  Avoid cat litter boxes and soil used by cats. These carry germs that can cause birth defects in the baby and possibly loss of the fetus by miscarriage or stillbirth.  Avoid all smoking, herbs, alcohol, and medicines not prescribed by your health care provider. Chemicals in these affect the  formation and growth of the baby.  Schedule a dentist appointment. At home, brush your teeth with a soft toothbrush and be gentle when you floss. SEEK MEDICAL CARE IF:   You have dizziness.  You have mild pelvic cramps, pelvic pressure, or nagging pain in the abdominal area.  You have persistent nausea, vomiting, or diarrhea.  You have a bad smelling vaginal discharge.  You have pain with urination.  You notice increased swelling in your face, hands, legs, or ankles. SEEK IMMEDIATE MEDICAL CARE IF:   You have a fever.  You are leaking fluid from your vagina.  You have spotting or bleeding from your vagina.  You have severe abdominal cramping or pain.  You have rapid weight gain or loss.  You vomit blood or material that looks like coffee grounds.  You are exposed to [place] measles and have never had them.  You are exposed to fifth disease or chickenpox.  You develop a severe headache.  You have shortness of breath.  You have any kind of trauma, such as from a fall or a car accident. Document Released: 10/01/2001 Document Revised: 02/21/2014 Document Reviewed: 08/17/2013 [facility] Patient Information 2015 [place], [place]. This information is not intended to replace advice given to you by your health care provider. Make sure you discuss any questions you have with your health care provider.  First Trimester of Pregnancy The first trimester of pregnancy is from week 1 until the end of week 12 (months 1 through 3). A week after a sperm fertilizes an egg, the egg will implant on the wall of the uterus. This embryo will begin to develop into a baby. Genes from you and your partner are forming the baby. The female genes determine whether the baby is a boy or a girl. At 6-8 weeks, the eyes and face are formed, and the heartbeat can be seen on ultrasound. At the end of 12 weeks, all the baby's organs are formed.  Now that you are pregnant, you will want to do everything you can to  have a healthy baby. Two of the most important things are to get good prenatal care and to follow your health care provider's instructions. Prenatal care is all the medical care you receive before the baby's birth. This care will help prevent, find, and treat any problems during the pregnancy and childbirth. BODY CHANGES Your body goes through many changes during pregnancy. The changes vary from woman to woman.   You may gain or lose  a couple of pounds at first.  You may feel sick to your stomach (nauseous) and throw up (vomit). If the vomiting is uncontrollable, call your health care provider.  You may tire easily.  You may develop headaches that can be relieved by medicines approved by your health care provider.  You may urinate more often. Painful urination may mean you have a bladder infection.  You may develop heartburn as a result of your pregnancy.  You may develop constipation because certain hormones are causing the muscles that push waste through your intestines to slow down.  You may develop hemorrhoids or swollen, bulging veins (varicose veins).  Your breasts may begin to grow larger and become tender. Your nipples may stick out more, and the tissue that surrounds them (areola) may become darker.  Your gums may bleed and may be sensitive to brushing and flossing.  Dark spots or blotches (chloasma, mask of pregnancy) may develop on your face. This will likely fade after the baby is born.  Your menstrual periods will stop.  You may have a loss of appetite.  You may develop cravings for certain kinds of food.  You may have changes in your emotions from day to day, such as being excited to be pregnant or being concerned that something may go wrong with the pregnancy and baby.  You may have more vivid and strange dreams.  You may have changes in your hair. These can include thickening of your hair, rapid growth, and changes in texture. Some women also have hair loss during  or after pregnancy, or hair that feels dry or thin. Your hair will most likely return to normal after your baby is born. WHAT TO EXPECT AT YOUR PRENATAL VISITS During a routine prenatal visit:  You will be weighed to make sure you and the baby are growing normally.  Your blood pressure will be taken.  Your abdomen will be measured to track your baby's growth.  The fetal heartbeat will be listened to starting around week 10 or 12 of your pregnancy.  Test results from any previous visits will be discussed. Your health care provider may ask you:  How you are feeling.  If you are feeling the baby move.  If you have had any abnormal symptoms, such as leaking fluid, bleeding, severe headaches, or abdominal cramping.  If you have any questions. Other tests that may be performed during your first trimester include:  Blood tests to find your blood type and to check for the presence of any previous infections. They will also be used to check for low iron levels (anemia) and Rh antibodies. Later in the pregnancy, blood tests for diabetes will be done along with other tests if problems develop.  Urine tests to check for infections, diabetes, or protein in the urine.  An ultrasound to confirm the proper growth and development of the baby.  An amniocentesis to check for possible genetic problems.  Fetal screens for spina bifida and Down syndrome.  You may need other tests to make sure you and the baby are doing well. HOME CARE INSTRUCTIONS  Medicines  Follow your health care provider's instructions regarding medicine use. Specific medicines may be either safe or unsafe to take during pregnancy.  Take your prenatal vitamins as directed.  If you develop constipation, try taking a stool softener if your health care provider approves. Diet  Eat regular, well-balanced meals. Choose a variety of foods, such as meat or vegetable-based protein, fish, milk and low-fat dairy products,  vegetables, fruits, and whole grain breads and cereals. Your health care provider will help you determine the amount of weight gain that is right for you.  Avoid raw meat and uncooked cheese. These carry germs that can cause birth defects in the baby.  Eating four or five small meals rather than three large meals a day may help relieve nausea and vomiting. If you start to feel nauseous, eating a few soda crackers can be helpful. Drinking liquids between meals instead of during meals also seems to help nausea and vomiting.  If you develop constipation, eat more high-fiber foods, such as fresh vegetables or fruit and whole grains. Drink enough fluids to keep your urine clear or pale yellow. Activity and Exercise  Exercise only as directed by your health care provider. Exercising will help you:  Control your weight.  Stay in shape.  Be prepared for labor and delivery.  Experiencing pain or cramping in the lower abdomen or low back is a good sign that you should stop exercising. Check with your health care provider before continuing normal exercises.  Try to avoid standing for long periods of time. Move your legs often if you must stand in one place for a long time.  Avoid heavy lifting.  Wear low-heeled shoes, and practice good posture.  You may continue to have sex unless your health care provider directs you otherwise. Relief of Pain or Discomfort  Wear a good support bra for breast tenderness.   Take warm sitz baths to soothe any pain or discomfort caused by hemorrhoids. Use hemorrhoid cream if your health care provider approves.   Rest with your legs elevated if you have leg cramps or low back pain.  If you develop varicose veins in your legs, wear support hose. Elevate your feet for 15 minutes, 3-4 times a day. Limit salt in your diet. Prenatal Care  Schedule your prenatal visits by the twelfth week of pregnancy. They are usually scheduled monthly at first, then more often in  the last 2 months before delivery.  Write down your questions. Take them to your prenatal visits.  Keep all your prenatal visits as directed by your health care provider. Safety  Wear your seat belt at all times when driving.  Make a list of emergency phone numbers, including numbers for family, friends, the hospital, and police and fire departments. General Tips  Ask your health care provider for a referral to a local prenatal education class. Begin classes no later than at the beginning of month 6 of your pregnancy.  Ask for help if you have counseling or nutritional needs during pregnancy. Your health care provider can offer advice or refer you to specialists for help with various needs.  Do not use hot tubs, steam rooms, or saunas.  Do not douche or use tampons or scented sanitary pads.  Do not cross your legs for long periods of time.  Avoid cat litter boxes and soil used by cats. These carry germs that can cause birth defects in the baby and possibly loss of the fetus by miscarriage or stillbirth.  Avoid all smoking, herbs, alcohol, and medicines not prescribed by your health care provider. Chemicals in these affect the formation and growth of the baby.  Schedule a dentist appointment. At home, brush your teeth with a soft toothbrush and be gentle when you floss. SEEK MEDICAL CARE IF:   You have dizziness.  You have mild pelvic cramps, pelvic pressure, or nagging pain in the abdominal area.  You  have persistent nausea, vomiting, or diarrhea.  You have a bad smelling vaginal discharge.  You have pain with urination.  You notice increased swelling in your face, hands, legs, or ankles. SEEK IMMEDIATE MEDICAL CARE IF:   You have a fever.  You are leaking fluid from your vagina.  You have spotting or bleeding from your vagina.  You have severe abdominal cramping or pain.  You have rapid weight gain or loss.  You vomit blood or material that looks like coffee  grounds.  You are exposed to [place] measles and have never had them.  You are exposed to fifth disease or chickenpox.  You develop a severe headache.  You have shortness of breath.  You have any kind of trauma, such as from a fall or a car accident. Document Released: 10/01/2001 Document Revised: 02/21/2014 Document Reviewed: 08/17/2013 [facility] Patient Information 2015 [place], [place]. This information is not intended to replace advice given to you by your health care provider. Make sure you discuss any questions you have with your health care provider.

## 2015-01-02 NOTE — Progress Notes (Signed)
Subjective:    Susan Hayes is a [id number] [redacted]w[redacted]d being seen today for her first obstetrical visit.  Her obstetrical history is significant for poor mild supply w/ all children  . Patient does intend to breast feed. Pregnancy history fully reviewed.  Patient reports no bleeding, no cramping, vomiting and left and pressure x 2 days. Vomiting ~ 2 x per day. Also reports fever x 5 days w/ cough and nasal congestion. Neg flu test at urgent care. No SOB, sore throat, GI complaints.    Last Pap 03/2014 normal. Hx HRHPV 2013.  Filed Vitals:   01/02/15 1353  BP: 92/54  Pulse: 66  Weight: 122 lb (55.339 kg)  Temp :     98.1   HISTORY: OB History  Gravida Para Term Preterm AB SAB TAB Ectopic Multiple Living  # Outcome Date GA Lbr Len/2nd Weight Sex Delivery Anes PTL Lv  5 Current           4 Term 06/29/12 [redacted]w[redacted]d 18:30 / 00:06  F Vag-Spont EPI  Y  3 Term 04/19/11 [redacted]w[redacted]d   F Vag-Spont None N Y  2 Term 12/25/03 [redacted]w[redacted]d  7 lb 2 oz (3.232 kg) M Vag-Spont EPI N Y  1 Term 01/25/02 [redacted]w[redacted]d  7 lb (3.175 kg) M Vag-Spont EPI N Y     Past Medical History  Diagnosis Date  . Anemia   . Abnormal Pap smear     Ascus  HPV   Past Surgical History  Procedure Laterality Date  . Tonsillectomy    . Wisdom tooth extraction     No family history on file.   Exam    Uterus:     Pelvic Exam:    Perineum: No Hemorrhoids, Normal Perineum   Vulva: Bartholin's, Urethra, Skene's normal   Vagina:  normal mucosa   pH: NA   Cervix: declined   Adnexa: no mass, fullness, tenderness   Bony Pelvis: gynecoid  System: Breast:  declined   Skin: normal coloration and turgor, no rashes    Neurologic: oriented, normal mood, grossly non-focal   Extremities: normal strength, tone, and muscle mass   HEENT PERRLA, sclera clear, anicteric, oropharynx clear, no lesions, neck supple with midline trachea and thyroid without masses   Mouth/Teeth mucous membranes moist, pharynx normal without lesions and dental  hygiene good   Neck supple and no masses   Cardiovascular: regular rate and rhythm, no murmurs or gallops   Respiratory:  appears well, vitals normal, no respiratory distress, acyanotic, normal RR, nasal congestion with pale boggy mucosa noted, neck free of mass or lymphadenopathy, chest clear, no wheezing, crepitations, rhonchi, normal symmetric air entry   Abdomen: soft, non-tender; bowel sounds normal; no masses,  no organomegaly   Urinary: urethral meatus normal      Assessment:    Pregnancy: [id number] Patient Active Problem List   Diagnosis Date Noted  . Supervision of normal pregnancy in first trimester 01/02/2015  . History of abnormal Pap smear 06/15/2012  Viral respiratory infection N/V pregnancy      Plan:     Initial labs drawn. Prenatal vitamins. Problem list reviewed and updated. Genetic Screening discussed First Screen, paqnorama: declined.  Ultrasound discussed; fetal survey: requested.  Follow up in 4 weeks. 75% of 40 min visit spent on counseling and coordination of care.  If no improvement in respiratory Sx in 2-3 days or if fevers return, SOB, worsening cough come to  [facility] for CXR, ABX.    Dorathy KinsmanSMITH, Susan Vaillancourt 01/02/2015

## 2015-01-02 NOTE — Progress Notes (Signed)
[facility] shows CRL 1.1cm [id number] and FHR 122bpm.

## 2015-01-03 LAB — PRENATAL PROFILE (SOLSTAS)
Antibody Screen: NEGATIVE
BASOS PCT: 0 % (ref 0–1)
Basophils Absolute: 0 10*3/uL (ref 0.0–0.1)
EOS ABS: 0 10*3/uL (ref 0.0–0.7)
EOS PCT: 0 % (ref 0–5)
HCT: 35.8 % — ABNORMAL LOW (ref 36.0–46.0)
HEMOGLOBIN: 11.8 g/dL — AB (ref 12.0–15.0)
HIV 1&2 Ab, 4th Generation: NONREACTIVE
Hepatitis B Surface Ag: NEGATIVE
LYMPHS ABS: 2.4 10*3/uL (ref 0.7–4.0)
Lymphocytes Relative: 21 % (ref 12–46)
MCH: 28.1 pg (ref 26.0–34.0)
MCHC: 33 g/dL (ref 30.0–36.0)
MCV: 85.2 fL (ref 78.0–100.0)
MPV: 11.3 fL (ref 8.6–12.4)
Monocytes Absolute: 0.7 10*3/uL (ref 0.1–1.0)
Monocytes Relative: 6 % (ref 3–12)
Neutro Abs: 8.2 10*3/uL — ABNORMAL HIGH (ref 1.7–7.7)
Neutrophils Relative %: 73 % (ref 43–77)
Platelets: 191 10*3/uL (ref 150–400)
RBC: 4.2 MIL/uL (ref 3.87–5.11)
RDW: 14 % (ref 11.5–15.5)
RUBELLA: 1.47 {index} — AB (ref <0.90)
Rh Type: POSITIVE
WBC: 11.3 10*3/uL — ABNORMAL HIGH (ref 4.0–10.5)

## 2015-01-03 LAB — CULTURE, OB URINE

## 2015-01-03 LAB — GC/CHLAMYDIA PROBE AMP
CT Probe RNA: NEGATIVE
GC PROBE AMP APTIMA: NEGATIVE

## 2015-01-04 ENCOUNTER — Telehealth: Payer: Self-pay | Admitting: *Deleted

## 2015-01-04 DIAGNOSIS — J329 Chronic sinusitis, unspecified: Secondary | ICD-10-CM

## 2015-01-04 MED ORDER — AZITHROMYCIN 250 MG PO TABS: ORAL_TABLET | ORAL | Status: DC

## 2015-01-04 NOTE — Telephone Encounter (Signed)
Pt called stating that her sinusitis is getting worse.  She was seen by Midwife on Monday and was told to call if getting worse.  Spoke with Dr Marice Potterove who prescribed ZPak.

## 2015-01-30 ENCOUNTER — Ambulatory Visit (INDEPENDENT_AMBULATORY_CARE_PROVIDER_SITE_OTHER): Payer: 59 | Admitting: Advanced Practice Midwife

## 2015-01-30 VITALS — BP 105/72 | HR 68 | Wt 129.0 lb

## 2015-01-30 DIAGNOSIS — Z3491 Encounter for supervision of normal pregnancy, unspecified, first trimester: Secondary | ICD-10-CM

## 2015-01-30 DIAGNOSIS — O26851 Spotting complicating pregnancy, first trimester: Secondary | ICD-10-CM

## 2015-01-30 DIAGNOSIS — Z3481 Encounter for supervision of other normal pregnancy, first trimester: Secondary | ICD-10-CM

## 2015-01-30 NOTE — Patient Instructions (Signed)
Vaginal Bleeding During Pregnancy, First Trimester  A small amount of bleeding (spotting) from the vagina is relatively common in early pregnancy. It usually stops on its own. Various things may cause bleeding or spotting in early pregnancy. Some bleeding may be related to the pregnancy, and some may not. In most cases, the bleeding is normal and is not a problem. However, bleeding can also be a sign of something serious. Be sure to tell your health care provider about any vaginal bleeding right away.  Some possible causes of vaginal bleeding during the first trimester include:  · Infection or inflammation of the cervix.  · Growths (polyps) on the cervix.  · Miscarriage or threatened miscarriage.  · Pregnancy tissue has developed outside of the uterus and in a fallopian tube (tubal pregnancy).  · Tiny cysts have developed in the uterus instead of pregnancy tissue (molar pregnancy).  HOME CARE INSTRUCTIONS   Watch your condition for any changes. The following actions may help to lessen any discomfort you are feeling:  · Follow your health care provider's instructions for limiting your activity. If your health care provider orders bed rest, you may need to stay in bed and only get up to use the bathroom. However, your health care provider may allow you to continue light activity.  · If needed, make plans for someone to help with your regular activities and responsibilities while you are on bed rest.  · Keep track of the number of pads you use each day, how often you change pads, and how soaked (saturated) they are. Write this down.  · Do not use tampons. Do not douche.  · Do not have sexual intercourse or orgasms until approved by your health care provider.  · If you pass any tissue from your vagina, save the tissue so you can show it to your health care provider.  · Only take over-the-counter or prescription medicines as directed by your health care provider.  · Do not take aspirin because it can make you  bleed.  · Keep all follow-up appointments as directed by your health care provider.  SEEK MEDICAL CARE IF:  · You have any vaginal bleeding during any part of your pregnancy.  · You have cramps or labor pains.  · You have a fever, not controlled by medicine.  SEEK IMMEDIATE MEDICAL CARE IF:   · You have severe cramps in your back or belly (abdomen).  · You pass large clots or tissue from your vagina.  · Your bleeding increases.  · You feel light-headed or weak, or you have fainting episodes.  · You have chills.  · You are leaking fluid or have a gush of fluid from your vagina.  · You pass out while having a bowel movement.  MAKE SURE YOU:  · Understand these instructions.  · Will watch your condition.  · Will get help right away if you are not doing well or get worse.  Document Released: 07/17/2005 Document Revised: 10/12/2013 Document Reviewed: 06/14/2013  Exit[employer]® Patient Information ©2015 [employer]. This information is not intended to replace advice given to you by your health care provider. Make sure you discuss any questions you have with your health care provider.

## 2015-01-30 NOTE — Progress Notes (Signed)
Reviewed NOB labs. Declined genetic testing and genetic counseling for AMA. Had episode of spotting last week. None since. Cervix closed.

## 2015-02-27 ENCOUNTER — Encounter: Payer: 59 | Admitting: Obstetrics & Gynecology

## 2015-05-10 ENCOUNTER — Other Ambulatory Visit: Payer: Self-pay

## 2015-05-11 LAB — CYTOLOGY - PAP

## 2015-07-21 ENCOUNTER — Other Ambulatory Visit: Payer: Self-pay | Admitting: *Deleted

## 2015-07-21 ENCOUNTER — Other Ambulatory Visit: Payer: Self-pay | Admitting: Obstetrics and Gynecology

## 2015-07-21 DIAGNOSIS — N6320 Unspecified lump in the left breast, unspecified quadrant: Secondary | ICD-10-CM

## 2015-07-21 LAB — OB RESULTS CONSOLE GBS: STREP GROUP B AG: NEGATIVE

## 2015-07-25 ENCOUNTER — Ambulatory Visit
Admission: RE | Admit: 2015-07-25 | Discharge: 2015-07-25 | Disposition: A | Discharge status: Home / Self Care | Payer: Commercial Indemnity | Source: Ambulatory Visit | Attending: *Deleted | Admitting: *Deleted

## 2015-07-25 DIAGNOSIS — N6320 Unspecified lump in the left breast, unspecified quadrant: Secondary | ICD-10-CM

## 2015-08-18 ENCOUNTER — Inpatient Hospital Stay (HOSPITAL_COMMUNITY): Payer: Managed Care, Other (non HMO) | Admitting: Anesthesiology

## 2015-08-18 ENCOUNTER — Encounter (HOSPITAL_COMMUNITY): Payer: Self-pay

## 2015-08-18 ENCOUNTER — Inpatient Hospital Stay (HOSPITAL_COMMUNITY)
Admission: AD | Admit: 2015-08-18 | Discharge: 2015-08-20 | DRG: 775 | Disposition: A | Discharge status: Home / Self Care | Payer: Managed Care, Other (non HMO) | Source: Ambulatory Visit | Attending: Obstetrics and Gynecology | Admitting: Obstetrics and Gynecology

## 2015-08-18 DIAGNOSIS — Z3480 Encounter for supervision of other normal pregnancy, unspecified trimester: Secondary | ICD-10-CM

## 2015-08-18 DIAGNOSIS — O9902 Anemia complicating childbirth: Principal | ICD-10-CM | POA: Diagnosis not present

## 2015-08-18 DIAGNOSIS — O4292 Full-term premature rupture of membranes, unspecified as to length of time between rupture and onset of labor: Secondary | ICD-10-CM | POA: Diagnosis present

## 2015-08-18 DIAGNOSIS — Z3A39 39 weeks gestation of pregnancy: Secondary | ICD-10-CM

## 2015-08-18 LAB — TYPE AND SCREEN
ABO/RH(D): O POS
Antibody Screen: NEGATIVE

## 2015-08-18 LAB — CBC
HCT: 32.3 % — ABNORMAL LOW (ref 36.0–46.0)
HEMOGLOBIN: 10.8 g/dL — AB (ref 12.0–15.0)
MCH: 28.5 pg (ref 26.0–34.0)
MCHC: 33.4 g/dL (ref 30.0–36.0)
MCV: 85.2 fL (ref 78.0–100.0)
Platelets: 159 10*3/uL (ref 150–400)
RBC: 3.79 MIL/uL — AB (ref 3.87–5.11)
RDW: 14.3 % (ref 11.5–15.5)
WBC: 11.3 10*3/uL — AB (ref 4.0–10.5)

## 2015-08-18 LAB — ABO/RH: ABO/RH(D): O POS

## 2015-08-18 MED ORDER — MEASLES, MUMPS & RUBELLA VAC ~~LOC~~ INJ: 0.5000 mL | INJECTION | Freq: Once | SUBCUTANEOUS | Status: DC

## 2015-08-18 MED ORDER — DIBUCAINE 1 % RE OINT: 1.0000 "application " | TOPICAL_OINTMENT | RECTAL | Status: DC | PRN

## 2015-08-18 MED ORDER — TERBUTALINE SULFATE 1 MG/ML IJ SOLN: 0.2500 mg | Freq: Once | INTRAMUSCULAR | Status: DC | PRN

## 2015-08-18 MED ORDER — ACETAMINOPHEN 325 MG PO TABS: 650.0000 mg | ORAL_TABLET | ORAL | Status: DC | PRN

## 2015-08-18 MED ORDER — OXYCODONE-ACETAMINOPHEN 5-325 MG PO TABS
1.0000 | ORAL_TABLET | ORAL | Status: DC | PRN
  Administered 2015-08-19: 1 via ORAL
  Filled 2015-08-18: qty 1

## 2015-08-18 MED ORDER — LACTATED RINGERS IV SOLN
INTRAVENOUS | Status: DC
  Administered 2015-08-18 (×2): via INTRAVENOUS
  Administered 2015-08-18: 500 mL via INTRAVENOUS

## 2015-08-18 MED ORDER — OXYCODONE-ACETAMINOPHEN 5-325 MG PO TABS: 2.0000 | ORAL_TABLET | ORAL | Status: DC | PRN

## 2015-08-18 MED ORDER — LIDOCAINE HCL (PF) 1 % IJ SOLN
INTRAMUSCULAR | Status: DC | PRN
  Administered 2015-08-18 (×2): 4 mL via EPIDURAL

## 2015-08-18 MED ORDER — ONDANSETRON HCL 4 MG/2ML IJ SOLN: 4.0000 mg | INTRAMUSCULAR | Status: DC | PRN

## 2015-08-18 MED ORDER — IBUPROFEN 600 MG PO TABS
600.0000 mg | ORAL_TABLET | Freq: Four times a day (QID) | ORAL | Status: DC
  Administered 2015-08-18 – 2015-08-20 (×8): 600 mg via ORAL
  Filled 2015-08-18 (×8): qty 1

## 2015-08-18 MED ORDER — ONDANSETRON HCL 4 MG PO TABS: 4.0000 mg | ORAL_TABLET | ORAL | Status: DC | PRN

## 2015-08-18 MED ORDER — FENTANYL 2.5 MCG/ML BUPIVACAINE 1/10 % EPIDURAL INFUSION (WH - ANES)
14.0000 mL/h | INTRAMUSCULAR | Status: DC | PRN
  Administered 2015-08-18: 14 mL/h via EPIDURAL
  Filled 2015-08-18: qty 125

## 2015-08-18 MED ORDER — OXYTOCIN 40 UNITS IN LACTATED RINGERS INFUSION - SIMPLE MED
62.5000 mL/h | INTRAVENOUS | Status: DC
  Filled 2015-08-18: qty 1000

## 2015-08-18 MED ORDER — BENZOCAINE-MENTHOL 20-0.5 % EX AERO
1.0000 "application " | INHALATION_SPRAY | CUTANEOUS | Status: DC | PRN
  Administered 2015-08-18: 1 via TOPICAL
  Filled 2015-08-18: qty 56

## 2015-08-18 MED ORDER — SENNOSIDES-DOCUSATE SODIUM 8.6-50 MG PO TABS
2.0000 | ORAL_TABLET | ORAL | Status: DC
  Administered 2015-08-19 (×2): 2 via ORAL
  Filled 2015-08-18 (×2): qty 2

## 2015-08-18 MED ORDER — EPHEDRINE 5 MG/ML INJ: 10.0000 mg | INTRAVENOUS | Status: DC | PRN

## 2015-08-18 MED ORDER — LACTATED RINGERS IV SOLN: 500.0000 mL | INTRAVENOUS | Status: DC | PRN

## 2015-08-18 MED ORDER — OXYTOCIN BOLUS FROM INFUSION
500.0000 mL | INTRAVENOUS | Status: DC
  Administered 2015-08-18: 500 mL via INTRAVENOUS

## 2015-08-18 MED ORDER — TETANUS-DIPHTH-ACELL PERTUSSIS 5-2.5-18.5 LF-MCG/0.5 IM SUSP: 0.5000 mL | Freq: Once | INTRAMUSCULAR | Status: DC

## 2015-08-18 MED ORDER — ONDANSETRON HCL 4 MG/2ML IJ SOLN
4.0000 mg | Freq: Four times a day (QID) | INTRAMUSCULAR | Status: DC | PRN
Start: 2015-08-18 — End: 2015-08-18

## 2015-08-18 MED ORDER — OXYCODONE-ACETAMINOPHEN 5-325 MG PO TABS: 1.0000 | ORAL_TABLET | ORAL | Status: DC | PRN

## 2015-08-18 MED ORDER — PHENYLEPHRINE 40 MCG/ML (10ML) SYRINGE FOR IV PUSH (FOR BLOOD PRESSURE SUPPORT)
80.0000 ug | PREFILLED_SYRINGE | INTRAVENOUS | Status: DC | PRN
  Filled 2015-08-18: qty 20

## 2015-08-18 MED ORDER — ZOLPIDEM TARTRATE 5 MG PO TABS
5.0000 mg | ORAL_TABLET | Freq: Every evening | ORAL | Status: DC | PRN
Start: 2015-08-18 — End: 2015-08-20

## 2015-08-18 MED ORDER — LIDOCAINE HCL (PF) 1 % IJ SOLN: 30.0000 mL | INTRAMUSCULAR | Status: DC | PRN

## 2015-08-18 MED ORDER — SIMETHICONE 80 MG PO CHEW: 80.0000 mg | CHEWABLE_TABLET | ORAL | Status: DC | PRN

## 2015-08-18 MED ORDER — FLEET ENEMA 7-19 GM/118ML RE ENEM: 1.0000 | ENEMA | RECTAL | Status: DC | PRN

## 2015-08-18 MED ORDER — CITRIC ACID-SODIUM CITRATE 334-500 MG/5ML PO SOLN: 30.0000 mL | ORAL | Status: DC | PRN

## 2015-08-18 MED ORDER — FERROUS SULFATE 325 (65 FE) MG PO TABS
325.0000 mg | ORAL_TABLET | Freq: Two times a day (BID) | ORAL | Status: DC
  Administered 2015-08-18 – 2015-08-20 (×4): 325 mg via ORAL
  Filled 2015-08-18 (×6): qty 1

## 2015-08-18 MED ORDER — WITCH HAZEL-GLYCERIN EX PADS: 1.0000 "application " | MEDICATED_PAD | CUTANEOUS | Status: DC | PRN

## 2015-08-18 MED ORDER — DIPHENHYDRAMINE HCL 50 MG/ML IJ SOLN
12.5000 mg | INTRAMUSCULAR | Status: DC | PRN
Start: 2015-08-18 — End: 2015-08-18

## 2015-08-18 MED ORDER — OXYTOCIN 40 UNITS IN LACTATED RINGERS INFUSION - SIMPLE MED
1.0000 m[IU]/min | INTRAVENOUS | Status: DC
  Administered 2015-08-18: 2 m[IU]/min via INTRAVENOUS
  Administered 2015-08-18: 4 m[IU]/min via INTRAVENOUS

## 2015-08-18 NOTE — MAU Note (Signed)
Notified provider of G5P4 patient that rupture at 0415 with a positive fern slide contracting every 6-7 mins 3/90/-2. Provider said he would be in to see patient.

## 2015-08-18 NOTE — Anesthesia Procedure Notes (Signed)
Epidural Patient location during procedure: OB Start time: 08/18/2015 12:00 PM  Staffing Anesthesiologist: Mal AmabileFOSTER, Zaydrian Batta Performed by: anesthesiologist   Preanesthetic Checklist Completed: patient identified, site marked, surgical consent, pre-op evaluation, timeout performed, IV checked, risks and benefits discussed and monitors and equipment checked  Epidural Patient position: sitting Prep: site prepped and draped and DuraPrep Patient monitoring: continuous pulse ox and blood pressure Approach: midline Location: L3-L4 Injection technique: LOR air  Needle:  Needle type: Tuohy  Needle gauge: 17 G Needle length: 9 cm and 9 Needle insertion depth: 4 cm Catheter type: closed end flexible Catheter size: 19 Gauge Catheter at skin depth: 9 cm Test dose: negative and Other  Assessment Events: blood not aspirated, injection not painful, no injection resistance, negative IV test and no paresthesia  Additional Notes Patient identified. Risks and benefits discussed including failed block, incomplete  Pain control, post dural puncture headache, nerve damage, paralysis, blood pressure Changes, nausea, vomiting, reactions to medications-both toxic and allergic and post Partum back pain. All questions were answered. Patient expressed understanding and wished to proceed. Sterile technique was used throughout procedure. Epidural site was Dressed with sterile barrier dressing. No paresthesias, signs of intravascular injection Or signs of intrathecal spread were encountered.  Patient was more comfortable after the epidural was dosed. Please see RN's note for documentation of vital signs and FHR which are stable.

## 2015-08-18 NOTE — Lactation Note (Signed)
This note was copied from the chart of Susan Hayes. Lactation Consultation Note  Patient Name: Susan Vassie LollKristen Heyer ZOXWR'UToday's Date: 08/18/2015 Reason for consult: Initial assessment Mom was sleeping.   Maternal Data    Feeding Feeding Type: Breast Fed Length of feed: 5 min  LATCH Score/Interventions                      Lactation Tools Discussed/Used     Consult Status Consult Status: Follow-up Date: 08/19/15 Follow-up type: In-patient    Rulon Eisenmengerlizabeth E Rithvik Orcutt 08/18/2015, 10:29 PM

## 2015-08-18 NOTE — MAU Note (Signed)
Doppler baby's heart rate and it was in 120s.

## 2015-08-18 NOTE — MAU Note (Signed)
Patient presents with PROM at 0415 this morning and irregular contractions around 0600.

## 2015-08-18 NOTE — Anesthesia Preprocedure Evaluation (Signed)
Anesthesia Evaluation  Patient identified by MRN, date of birth, ID band Patient awake    Reviewed: Allergy & Precautions, H&P , Patient's Chart, lab work & pertinent test results  Airway Mallampati: II  TM Distance: >3 FB Neck ROM: full    Dental no notable dental hx. (+) Teeth Intact   Pulmonary neg pulmonary ROS,    Pulmonary exam normal breath sounds clear to auscultation       Cardiovascular negative cardio ROS Normal cardiovascular exam Rhythm:regular Rate:Normal     Neuro/Psych negative neurological ROS  negative psych ROS   GI/Hepatic negative GI ROS, Neg liver ROS,   Endo/Other  negative endocrine ROS  Renal/GU negative Renal ROS  negative genitourinary   Musculoskeletal   Abdominal   Peds  Hematology  (+) anemia ,   Anesthesia Other Findings   Reproductive/Obstetrics (+) Pregnancy                             Anesthesia Physical Anesthesia Plan  ASA: II  Anesthesia Plan: Epidural   Post-op Pain Management:    Induction:   Airway Management Planned:   Additional Equipment:   Intra-op Plan:   Post-operative Plan:   Informed Consent: I have reviewed the patients History and Physical, chart, labs and discussed the procedure including the risks, benefits and alternatives for the proposed anesthesia with the patient or authorized representative who has indicated his/her understanding and acceptance.     Plan Discussed with: Anesthesiologist  Anesthesia Plan Comments:         Anesthesia Quick Evaluation  

## 2015-08-18 NOTE — Anesthesia Postprocedure Evaluation (Signed)
  Anesthesia Post-op Note  Patient: Susan Hayes  Procedure(s) Performed: * No procedures listed *  Patient Location: Mother/Baby  Anesthesia Type:Epidural  Level of Consciousness: awake  Airway and Oxygen Therapy: Patient Spontanous Breathing  Post-op Pain: mild  Post-op Assessment: Patient's Cardiovascular Status Stable and Respiratory Function Stable              Post-op Vital Signs: stable  Last Vitals:  Filed Vitals:   08/18/15 1746  BP: 101/52  Pulse: 60  Temp: 36.2 C  Resp: 20    Complications: No apparent anesthesia complications

## 2015-08-18 NOTE — H&P (Signed)
Pt is a 39 y/o white female, G5P4004 at term who presents to L&D with SROM. PNC was uncomplicated.  On admission she was 3cm with +pool,+fern VSSAF PE: HEENT-wnl        ABD-gravid        FHTs-reactive IMP/ IUP at term with SROM Plan/ Admit

## 2015-08-18 NOTE — MAU Note (Signed)
Patient has positive fern slide. Called provider twice and left a message. Waiting on a call  Back to get admission orders.

## 2015-08-19 LAB — CBC
HCT: 25.7 % — ABNORMAL LOW (ref 36.0–46.0)
HEMOGLOBIN: 8.7 g/dL — AB (ref 12.0–15.0)
MCH: 29.1 pg (ref 26.0–34.0)
MCHC: 33.5 g/dL (ref 30.0–36.0)
MCV: 86.8 fL (ref 78.0–100.0)
PLATELETS: 125 10*3/uL — AB (ref 150–400)
RBC: 2.96 MIL/uL — ABNORMAL LOW (ref 3.87–5.11)
RDW: 14.3 % (ref 11.5–15.5)
WBC: 9.3 10*3/uL (ref 4.0–10.5)

## 2015-08-19 LAB — RPR: RPR: NONREACTIVE

## 2015-08-19 NOTE — Lactation Note (Signed)
This note was copied from the chart of Susan Paisleigh Vanderweide. Lactation Consultation Note  Patient Name: Susan Vassie LollKristen Schaner [id number]'LToday's Date: 08/19/2015 Reason for consult: Initial assessment Baby is 23hrs old, [facility]. Mom reports BF is going well and is not having any tenderness or pain. Mom reported that she has BF her other children but only BF for 3-4 weeks because she felt as though she did not have enough milk. Mom stated that she does both BF and formula feeding. Encouraged mom to avoid supplementing in the beginning especially and focus on breastfeeding. Mom reports that she has a pump from her insurance at home. Infant was sleeping & was recently circumcised. Mom tried to latch baby in cradle hold on her left breast but infant would not wake up. Demonstrated hand expression with mom who had good colostrum. Provided breastfeeding booklet, breastfeeding resources & feeding logs; discussed Lactation number & support groups. Discussed breastfeeding pages in Baby & Me booklet - different positions, latch tips, engorgement prevention & treatment, milk storage guidelines, # wet & dirty diapers. Encouraged mom to feed on demand/ feeding cues and that they may need to wake him if longer than 3.5-3.75hrs from last BF since he may be sleepy from his circumcision. Encouraged skin-to-skin. Encouraged mom to [facility] for [facility] feeding.  Maternal Data Has patient been taught Hand Expression?: Yes Does the patient have breastfeeding experience prior to this delivery?: Yes  Feeding Feeding Type: Breast Fed Length of feed: 25 min (per mom)  LATCH Score/Interventions Latch: Too sleepy or reluctant, no latch achieved, no sucking elicited. Intervention(s): Skin to skin;Waking techniques  Audible Swallowing: None Intervention(s): Skin to skin;Hand expression  Type of Nipple: Everted at rest and after stimulation  Comfort (Breast/Nipple): Soft / non-tender     Hold (Positioning):  Assistance needed to correctly position infant at breast and maintain latch. Intervention(s): Support Pillows;Position options  LATCH Score: 5  Lactation Tools Discussed/Used     Consult Status Consult Status: Follow-up Date: 08/20/15 Follow-up type: In-patient    Susan Hayes 08/19/2015, 2:07 PM

## 2015-08-19 NOTE — Progress Notes (Signed)
PPD#1 Pt without complaints. Does not want to go home today.  VSSAF IMP/ stable Plan/ routine care.

## 2015-08-20 NOTE — Lactation Note (Signed)
This note was copied from the chart of Susan Hayes. Lactation Consultation Note  Patient Name: Susan Hayes: 08/20/2015 Reason for consult: Follow-up assessment   With this experienced breast feeding mom, now 41 hours post partum. Mom denies any questions/concerns, baby latched well and feeding at this time. Mom knows to call lactation as needed.   Maternal Data    Feeding    LATCH Score/Interventions                      Lactation Tools Discussed/Used     Consult Status Consult Status: Complete Follow-up type: Call as needed    Susan Hayes, Susan Hayes Anne 08/20/2015, 8:14 AM

## 2015-08-20 NOTE — Progress Notes (Signed)
PPD#2 Pt without complaints, Ready for discharge IMP/ Stable Plan/ Discharge

## 2015-08-20 NOTE — Discharge Summary (Signed)
Obstetric Discharge Summary Reason for Admission: onset of labor Prenatal Procedures: ultrasound Intrapartum Procedures: spontaneous vaginal delivery Postpartum Procedures: none Complications-Operative and Postpartum: none HEMOGLOBIN  Date Value Ref Range Status  08/19/2015 8.7* 12.0 - 15.0 g/dL Final   HCT  Date Value Ref Range Status  08/19/2015 25.7* 36.0 - 46.0 % Final    Physical Exam:  General: alert Lochia: appropriate Uterine Fundus: firm   Discharge Diagnoses: Term Pregnancy-delivered  Discharge Information: Date: 08/20/2015 Activity: pelvic rest Diet: routine Medications: PNV and Ibuprofen Condition: stable Instructions: refer to practice specific booklet Discharge to: home Follow-up Information    Follow up with Levi AlandANDERSON,MARK E, MD. Schedule an appointment as soon as possible for a visit in 4 weeks.   Specialty:  Obstetrics and Gynecology   Contact information:   [address] [place] [id number] [phone]       Newborn Data: Live born female  Birth Weight: 6 lb 2.9 oz (2805 g) APGAR: 9, 9  Home with mother.  ANDERSON,MARK E 08/20/2015, 9:56 AM

## 2017-06-06 LAB — OB RESULTS CONSOLE ANTIBODY SCREEN: ANTIBODY SCREEN: NEGATIVE

## 2017-06-06 LAB — OB RESULTS CONSOLE ABO/RH: RH Type: POSITIVE

## 2017-06-06 LAB — OB RESULTS CONSOLE RUBELLA ANTIBODY, IGM: Rubella: IMMUNE

## 2017-06-06 LAB — OB RESULTS CONSOLE GC/CHLAMYDIA
Chlamydia: NEGATIVE
GC PROBE AMP, GENITAL: NEGATIVE

## 2017-06-06 LAB — OB RESULTS CONSOLE HEPATITIS B SURFACE ANTIGEN: Hepatitis B Surface Ag: NEGATIVE

## 2017-06-06 LAB — OB RESULTS CONSOLE HIV ANTIBODY (ROUTINE TESTING): HIV: NONREACTIVE

## 2017-06-06 LAB — OB RESULTS CONSOLE RPR: RPR: NONREACTIVE

## 2017-10-21 NOTE — L&D Delivery Note (Addendum)
Delivery Note At 1:51 PM a viable female was delivered via  (Presentation: LOA ;  ).  APGAR: pending; weight pending .   Placenta status: routine , .  Cord: 3VC  with the following complications: nichal x 1, reduced at delivery.  Cord pH: not sent  Anesthesia:  CLE Episiotomy:  None Lacerations:  1st Suture Repair: 3.0 Est. Blood Loss (mL):  400cc 1000mcg cytotec placed rectally for mild uterine atony - resolved.    It's a boy - "Susan Hayes"! Mom to postpartum.  Baby to Couplet care / Skin to Skin.  [name] 12/31/2017, 2:07 PM

## 2017-12-18 ENCOUNTER — Telehealth (HOSPITAL_COMMUNITY): Payer: Self-pay | Admitting: *Deleted

## 2017-12-18 ENCOUNTER — Encounter (HOSPITAL_COMMUNITY): Payer: Self-pay | Admitting: *Deleted

## 2017-12-18 NOTE — Telephone Encounter (Signed)
Preadmission screen  

## 2017-12-22 ENCOUNTER — Encounter (HOSPITAL_COMMUNITY): Payer: Self-pay | Admitting: *Deleted

## 2017-12-30 NOTE — H&P (Signed)
Susan Hayes is a 42 y.o. female presenting for IOL. OB History    Gravida Para Term Preterm AB Living   7 5 5   1 5    SAB TAB Ectopic Multiple Live Births   1     0 5     Past Medical History:  Diagnosis Date  . Abnormal Pap smear    Ascus  HPV  . Anemia   . History of viral meningitis   . Hx of acute pyelonephritis    Past Surgical History:  Procedure Laterality Date  . TONSILLECTOMY    . WISDOM TOOTH EXTRACTION     Family History: family history includes Thrombocytopenia in her sister. Social History:  reports that  has never smoked. she has never used smokeless tobacco. She reports that she does not drink alcohol or use drugs.     Maternal Diabetes: No Genetic Screening: Normal Maternal Ultrasounds/Referrals: Normal Fetal Ultrasounds or other Referrals:  None Maternal Substance Abuse:  No Significant Maternal Medications:  None Significant Maternal Lab Results:  None Other Comments:  None  ROS History   Last menstrual period 04/01/2017, unknown if currently breastfeeding. Exam Physical Exam  (from clinic) NAD, A&O NWOB Abd soft, nondistended, gravid SVE 3/1/-2  Prenatal labs: ABO, Rh: O/Positive/-- (08/17 0000) Antibody: Negative (08/17 0000) Rubella: Immune (08/17 0000) RPR: Nonreactive (08/17 0000)  HBsAg: Negative (08/17 0000)  HIV: Non-reactive (08/17 0000)  GBS:     Assessment/Plan: 42yo [id number] @ 39.1 wga presenting for IOL. Multip, favorable cervix. Plan AROM/pitocin. Female. GBS neg.     Susan Hayes Susan Hayes 12/30/2017, 4:35 PM

## 2017-12-31 ENCOUNTER — Inpatient Hospital Stay (HOSPITAL_COMMUNITY)
Admission: RE | Admit: 2017-12-31 | Discharge: 2018-01-02 | DRG: 807 | Disposition: A | Discharge status: Home / Self Care | Payer: Managed Care, Other (non HMO) | Source: Ambulatory Visit | Attending: Obstetrics and Gynecology | Admitting: Obstetrics and Gynecology

## 2017-12-31 ENCOUNTER — Inpatient Hospital Stay (HOSPITAL_COMMUNITY): Payer: Managed Care, Other (non HMO) | Admitting: Anesthesiology

## 2017-12-31 ENCOUNTER — Other Ambulatory Visit: Payer: Self-pay

## 2017-12-31 ENCOUNTER — Encounter (HOSPITAL_COMMUNITY): Payer: Self-pay

## 2017-12-31 DIAGNOSIS — D649 Anemia, unspecified: Secondary | ICD-10-CM | POA: Diagnosis present

## 2017-12-31 DIAGNOSIS — Z3A39 39 weeks gestation of pregnancy: Secondary | ICD-10-CM | POA: Diagnosis not present

## 2017-12-31 DIAGNOSIS — Z3490 Encounter for supervision of normal pregnancy, unspecified, unspecified trimester: Secondary | ICD-10-CM

## 2017-12-31 DIAGNOSIS — O9902 Anemia complicating childbirth: Secondary | ICD-10-CM | POA: Diagnosis present

## 2017-12-31 DIAGNOSIS — Z3483 Encounter for supervision of other normal pregnancy, third trimester: Secondary | ICD-10-CM | POA: Diagnosis present

## 2017-12-31 LAB — CBC
HEMATOCRIT: 30.7 % — AB (ref 36.0–46.0)
HEMOGLOBIN: 9.8 g/dL — AB (ref 12.0–15.0)
MCH: 26.2 pg (ref 26.0–34.0)
MCHC: 31.9 g/dL (ref 30.0–36.0)
MCV: 82.1 fL (ref 78.0–100.0)
Platelets: 174 10*3/uL (ref 150–400)
RBC: 3.74 MIL/uL — AB (ref 3.87–5.11)
RDW: 14.9 % (ref 11.5–15.5)
WBC: 9.2 10*3/uL (ref 4.0–10.5)

## 2017-12-31 LAB — TYPE AND SCREEN
ABO/RH(D): O POS
Antibody Screen: NEGATIVE

## 2017-12-31 MED ORDER — DIPHENHYDRAMINE HCL 25 MG PO CAPS
25.0000 mg | ORAL_CAPSULE | Freq: Four times a day (QID) | ORAL | Status: DC | PRN
  Administered 2017-12-31: 25 mg via ORAL
  Filled 2017-12-31: qty 1

## 2017-12-31 MED ORDER — EPHEDRINE 5 MG/ML INJ
10.0000 mg | INTRAVENOUS | Status: DC | PRN
Start: 2017-12-31 — End: 2017-12-31
  Filled 2017-12-31: qty 2

## 2017-12-31 MED ORDER — MISOPROSTOL 200 MCG PO TABS
1000.0000 ug | ORAL_TABLET | Freq: Once | ORAL | Status: AC
  Administered 2017-12-31: 1000 ug via RECTAL

## 2017-12-31 MED ORDER — IBUPROFEN 600 MG PO TABS
600.0000 mg | ORAL_TABLET | Freq: Four times a day (QID) | ORAL | Status: DC
  Administered 2017-12-31 – 2018-01-02 (×8): 600 mg via ORAL
  Filled 2017-12-31 (×8): qty 1

## 2017-12-31 MED ORDER — OXYCODONE HCL 5 MG PO TABS: 5.0000 mg | ORAL_TABLET | ORAL | Status: DC | PRN

## 2017-12-31 MED ORDER — ACETAMINOPHEN 325 MG PO TABS: 650.0000 mg | ORAL_TABLET | ORAL | Status: DC | PRN

## 2017-12-31 MED ORDER — FENTANYL CITRATE (PF) 100 MCG/2ML IJ SOLN
INTRAMUSCULAR | Status: AC
  Filled 2017-12-31: qty 2

## 2017-12-31 MED ORDER — OXYCODONE HCL 5 MG PO TABS: 10.0000 mg | ORAL_TABLET | ORAL | Status: DC | PRN

## 2017-12-31 MED ORDER — EPHEDRINE 5 MG/ML INJ
10.0000 mg | INTRAVENOUS | Status: DC | PRN
  Filled 2017-12-31: qty 2

## 2017-12-31 MED ORDER — PHENYLEPHRINE 40 MCG/ML (10ML) SYRINGE FOR IV PUSH (FOR BLOOD PRESSURE SUPPORT)
80.0000 ug | PREFILLED_SYRINGE | INTRAVENOUS | Status: DC | PRN
  Filled 2017-12-31: qty 10
  Filled 2017-12-31: qty 5

## 2017-12-31 MED ORDER — OXYTOCIN BOLUS FROM INFUSION
500.0000 mL | Freq: Once | INTRAVENOUS | Status: AC
  Administered 2017-12-31: 500 mL via INTRAVENOUS

## 2017-12-31 MED ORDER — DIBUCAINE 1 % RE OINT: 1.0000 "application " | TOPICAL_OINTMENT | RECTAL | Status: DC | PRN

## 2017-12-31 MED ORDER — DIPHENHYDRAMINE HCL 50 MG/ML IJ SOLN: 12.5000 mg | INTRAMUSCULAR | Status: DC | PRN

## 2017-12-31 MED ORDER — COCONUT OIL OIL: 1.0000 "application " | TOPICAL_OIL | Status: DC | PRN

## 2017-12-31 MED ORDER — OXYCODONE-ACETAMINOPHEN 5-325 MG PO TABS: 2.0000 | ORAL_TABLET | ORAL | Status: DC | PRN

## 2017-12-31 MED ORDER — LACTATED RINGERS IV SOLN
INTRAVENOUS | Status: DC
  Administered 2017-12-31: 08:00:00 via INTRAVENOUS

## 2017-12-31 MED ORDER — PHENYLEPHRINE 40 MCG/ML (10ML) SYRINGE FOR IV PUSH (FOR BLOOD PRESSURE SUPPORT)
80.0000 ug | PREFILLED_SYRINGE | INTRAVENOUS | Status: DC | PRN
  Filled 2017-12-31: qty 5

## 2017-12-31 MED ORDER — LACTATED RINGERS IV SOLN
500.0000 mL | Freq: Once | INTRAVENOUS | Status: AC
  Administered 2017-12-31: 500 mL via INTRAVENOUS

## 2017-12-31 MED ORDER — OXYTOCIN 40 UNITS IN LACTATED RINGERS INFUSION - SIMPLE MED
1.0000 m[IU]/min | INTRAVENOUS | Status: DC
  Administered 2017-12-31: 2 m[IU]/min via INTRAVENOUS
  Filled 2017-12-31: qty 1000

## 2017-12-31 MED ORDER — SENNOSIDES-DOCUSATE SODIUM 8.6-50 MG PO TABS
2.0000 | ORAL_TABLET | ORAL | Status: DC
  Administered 2017-12-31 – 2018-01-01 (×2): 2 via ORAL
  Filled 2017-12-31 (×2): qty 2

## 2017-12-31 MED ORDER — ZOLPIDEM TARTRATE 5 MG PO TABS: 5.0000 mg | ORAL_TABLET | Freq: Every evening | ORAL | Status: DC | PRN

## 2017-12-31 MED ORDER — TERBUTALINE SULFATE 1 MG/ML IJ SOLN
0.2500 mg | Freq: Once | INTRAMUSCULAR | Status: DC | PRN
  Filled 2017-12-31: qty 1

## 2017-12-31 MED ORDER — ONDANSETRON HCL 4 MG PO TABS: 4.0000 mg | ORAL_TABLET | ORAL | Status: DC | PRN

## 2017-12-31 MED ORDER — BUPIVACAINE HCL (PF) 0.25 % IJ SOLN
INTRAMUSCULAR | Status: DC | PRN
  Administered 2017-12-31 (×2): 5 mL via EPIDURAL

## 2017-12-31 MED ORDER — SOD CITRATE-CITRIC ACID 500-334 MG/5ML PO SOLN: 30.0000 mL | ORAL | Status: DC | PRN

## 2017-12-31 MED ORDER — BENZOCAINE-MENTHOL 20-0.5 % EX AERO
1.0000 "application " | INHALATION_SPRAY | CUTANEOUS | Status: DC | PRN
  Administered 2017-12-31: 1 via TOPICAL
  Filled 2017-12-31: qty 56

## 2017-12-31 MED ORDER — ONDANSETRON HCL 4 MG/2ML IJ SOLN: 4.0000 mg | INTRAMUSCULAR | Status: DC | PRN

## 2017-12-31 MED ORDER — SIMETHICONE 80 MG PO CHEW: 80.0000 mg | CHEWABLE_TABLET | ORAL | Status: DC | PRN

## 2017-12-31 MED ORDER — WITCH HAZEL-GLYCERIN EX PADS: 1.0000 "application " | MEDICATED_PAD | CUTANEOUS | Status: DC | PRN

## 2017-12-31 MED ORDER — PRENATAL MULTIVITAMIN CH
1.0000 | ORAL_TABLET | Freq: Every day | ORAL | Status: DC
  Administered 2018-01-01 – 2018-01-02 (×2): 1 via ORAL
  Filled 2017-12-31 (×2): qty 1

## 2017-12-31 MED ORDER — OXYCODONE-ACETAMINOPHEN 5-325 MG PO TABS: 1.0000 | ORAL_TABLET | ORAL | Status: DC | PRN

## 2017-12-31 MED ORDER — ONDANSETRON HCL 4 MG/2ML IJ SOLN: 4.0000 mg | Freq: Four times a day (QID) | INTRAMUSCULAR | Status: DC | PRN

## 2017-12-31 MED ORDER — FENTANYL CITRATE (PF) 100 MCG/2ML IJ SOLN
100.0000 ug | Freq: Once | INTRAMUSCULAR | Status: AC
  Administered 2017-12-31: 100 ug via EPIDURAL

## 2017-12-31 MED ORDER — LIDOCAINE HCL (PF) 1 % IJ SOLN
30.0000 mL | INTRAMUSCULAR | Status: DC | PRN
  Filled 2017-12-31: qty 30

## 2017-12-31 MED ORDER — TETANUS-DIPHTH-ACELL PERTUSSIS 5-2.5-18.5 LF-MCG/0.5 IM SUSP: 0.5000 mL | Freq: Once | INTRAMUSCULAR | Status: DC

## 2017-12-31 MED ORDER — LACTATED RINGERS IV SOLN: 500.0000 mL | INTRAVENOUS | Status: DC | PRN

## 2017-12-31 MED ORDER — LIDOCAINE HCL (PF) 1 % IJ SOLN
INTRAMUSCULAR | Status: DC | PRN
  Administered 2017-12-31 (×2): 5 mL via EPIDURAL

## 2017-12-31 MED ORDER — OXYTOCIN 40 UNITS IN LACTATED RINGERS INFUSION - SIMPLE MED: 2.5000 [IU]/h | INTRAVENOUS | Status: DC

## 2017-12-31 MED ORDER — MISOPROSTOL 200 MCG PO TABS
ORAL_TABLET | ORAL | Status: AC
  Filled 2017-12-31: qty 5

## 2017-12-31 MED ORDER — FLEET ENEMA 7-19 GM/118ML RE ENEM: 1.0000 | ENEMA | RECTAL | Status: DC | PRN

## 2017-12-31 MED ORDER — FENTANYL 2.5 MCG/ML BUPIVACAINE 1/10 % EPIDURAL INFUSION (WH - ANES)
14.0000 mL/h | INTRAMUSCULAR | Status: DC | PRN
  Administered 2017-12-31: 14 mL/h via EPIDURAL
  Filled 2017-12-31: qty 100

## 2017-12-31 NOTE — Anesthesia Procedure Notes (Signed)
Epidural Patient location during procedure: OB Start time: 12/31/2017 12:14 PM End time: 12/31/2017 12:29 PM  Staffing Anesthesiologist: Leonides GrillsEllender, Josh Nicolosi P, MD Performed: anesthesiologist   Preanesthetic Checklist Completed: patient identified, site marked, pre-op evaluation, timeout performed, IV checked, risks and benefits discussed and monitors and equipment checked  Epidural Patient position: sitting Prep: DuraPrep Patient monitoring: heart rate, cardiac monitor, continuous pulse ox and blood pressure Approach: midline Location: L4-L5 Injection technique: LOR air  Needle:  Needle type: Tuohy  Needle gauge: 17 G Needle length: 9 cm Needle insertion depth: 5 cm Catheter type: closed end flexible Catheter size: 19 Gauge Catheter at skin depth: 10 cm Test dose: negative and Other  Assessment Events: blood not aspirated, injection not painful, no injection resistance and negative IV test  Additional Notes Informed consent obtained prior to proceeding including risk of failure, 1% risk of PDPH, risk of minor discomfort and bruising. Discussed alternatives to epidural analgesia and patient desires to proceed.  Timeout performed pre-procedure verifying patient name, procedure, and platelet count.  Patient tolerated procedure well. Reason for block:procedure for pain

## 2017-12-31 NOTE — Progress Notes (Signed)
10 instruments 5 big sponges 5 small sponges 2 injectables MO 

## 2017-12-31 NOTE — Anesthesia Pain Management Evaluation Note (Signed)
  CRNA Pain Management Visit Note  Patient: Susan Hayes, 42 y.o., female  "Hello I am a [facility] of the [facility]. We have an anesthesia team available at all times to provide care throughout the hospital, including epidural management and anesthesia for C-section. I don't know your plan for the delivery whether it a natural birth, water birth, IV sedation, nitrous supplementation, doula or epidural, but we want to meet your pain goals."   1.Was your pain managed to your expectations on prior hospitalizations?   Yes   2.What is your expectation for pain management during this hospitalization?     Epidural  3.How can we help you reach that goal? epidural  Record the patient's initial score and the patient's pain goal.   Pain: 0/10  Pain Goal: 0/20 [facility] wants you to be able to say your pain was always managed very well.  Salome ArntSterling, Tanay Massiah Marie 12/31/2017

## 2017-12-31 NOTE — Progress Notes (Signed)
While in recovery period after delivery, pt states she noticed her left eye & left side of bridge of nose began to burn accompanied with a runny nose.  Eye noted to be slightly swollen, glassy appearance, & mild reddness.  Will con't to monitor.

## 2017-12-31 NOTE — Anesthesia Preprocedure Evaluation (Signed)
Anesthesia Evaluation  Patient identified by MRN, date of birth, ID band Patient awake    Reviewed: Allergy & Precautions, H&P , NPO status , Patient's Chart, lab work & pertinent test results  History of Anesthesia Complications Negative for: history of anesthetic complications  Airway Mallampati: II  TM Distance: >3 FB Neck ROM: full    Dental no notable dental hx. (+) Teeth Intact   Pulmonary neg pulmonary ROS,    Pulmonary exam normal breath sounds clear to auscultation       Cardiovascular negative cardio ROS Normal cardiovascular exam Rhythm:regular Rate:Normal     Neuro/Psych negative neurological ROS  negative psych ROS   GI/Hepatic negative GI ROS, Neg liver ROS,   Endo/Other  negative endocrine ROS  Renal/GU negative Renal ROS  negative genitourinary   Musculoskeletal negative musculoskeletal ROS (+)   Abdominal   Peds negative pediatric ROS (+)  Hematology  (+) anemia ,   Anesthesia Other Findings   Reproductive/Obstetrics negative OB ROS (+) Pregnancy                             Anesthesia Physical Anesthesia Plan  ASA: II  Anesthesia Plan: Epidural   Post-op Pain Management:    Induction:   PONV Risk Score and Plan:   Airway Management Planned:   Additional Equipment:   Intra-op Plan:   Post-operative Plan:   Informed Consent: I have reviewed the patients History and Physical, chart, labs and discussed the procedure including the risks, benefits and alternatives for the proposed anesthesia with the patient or authorized representative who has indicated his/her understanding and acceptance.     Plan Discussed with:   Anesthesia Plan Comments:         Anesthesia Quick Evaluation

## 2017-12-31 NOTE — Progress Notes (Signed)
So c/o.  AROM for clear fluid. SVE 4/lg/-2, vertex Pitocin GBS neg

## 2018-01-01 LAB — CBC
HEMATOCRIT: 23.6 % — AB (ref 36.0–46.0)
Hemoglobin: 7.7 g/dL — ABNORMAL LOW (ref 12.0–15.0)
MCH: 26.6 pg (ref 26.0–34.0)
MCHC: 32.6 g/dL (ref 30.0–36.0)
MCV: 81.7 fL (ref 78.0–100.0)
Platelets: 134 10*3/uL — ABNORMAL LOW (ref 150–400)
RBC: 2.89 MIL/uL — AB (ref 3.87–5.11)
RDW: 14.9 % (ref 11.5–15.5)
WBC: 11.7 10*3/uL — AB (ref 4.0–10.5)

## 2018-01-01 LAB — RPR: RPR Ser Ql: NONREACTIVE

## 2018-01-01 MED ORDER — LORATADINE 10 MG PO TABS
10.0000 mg | ORAL_TABLET | Freq: Every day | ORAL | Status: DC
  Administered 2018-01-01 – 2018-01-02 (×2): 10 mg via ORAL
  Filled 2018-01-01 (×2): qty 1

## 2018-01-01 NOTE — Plan of Care (Signed)
  Role Relationship: Ability to demonstrate positive interaction with newborn will improve 01/01/2018 1038 - Completed/Met by Tollie Eth, RN Note Baby feeding well per mother. Baby latched while RN in room; however, baby still sleepy and needing stimulation to wake. When baby sucks, baby has good strong suck pattern. Encouraged mother to continue to do skin to skin and if baby does not show feeding cues every few hours, to wake baby up to attempt. Mother states baby fed frequently through the night.

## 2018-01-01 NOTE — Progress Notes (Signed)
Pt seen and discussed concerns related to nasal drainage and burning in left eye that occurred after epidural removal.  Pt was reassured that this was not related to epidural.  I agree with Dr. Marcelle OverlieHolland that ENT would be the most reasonable next step.  The patient voiced understanding.

## 2018-01-01 NOTE — Addendum Note (Signed)
Addendum  created 01/01/18 1841 by Heather RobertsSinger, Fletcher Ostermiller, MD   Sign clinical note

## 2018-01-01 NOTE — Progress Notes (Signed)
Post Partum Day 1 Subjective: c/o L eye watering and clear nasal drainage, no other visual complaints  Objective: Blood pressure (!) 109/52, pulse 72, temperature 98.1 F (36.7 C), temperature source Oral, resp. rate 18, height 5\' 4"  (1.626 m), weight 165 lb (74.8 kg), last menstrual period 04/01/2017, SpO2 100 %, unknown if currently breastfeeding.  Physical Exam:  General: alert Lochia: appropriate Uterine Fundus: firm Incision: healing well DVT Evaluation: No evidence of DVT seen on physical exam.  Recent Labs    12/31/17 0807 01/01/18 0538  HGB 9.8* 7.7*  HCT 30.7* 23.6*    Assessment/Plan: Plan for discharge tomorrow  claritin prn Will obsv watery eye complaints   LOS: 1 day   Meriel PicaRichard M Kadden Osterhout 01/01/2018, 9:09 AM

## 2018-01-01 NOTE — Progress Notes (Signed)
Patient and significant other concerned about patient's nasal and eye drainage and burning sensation from left side of face and eye. Patient truly feels that it is related to epidural and states that the Claritin is not effective that was ordered this morning. Patient's significant other voiced concern that they were not being heard and that they were "being blown off." Patient and significant other state that they would like some form of resolution to the issue prior to discharge.  Called Dr. Marcelle OverlieHolland who stated that this issue was not related to epidural and that this would need to be an outpatient referral to an ENT or ophthalmology. Dr. Marcelle OverlieHolland stated that RN may notify anesthesia to visit patient again to reassure patient or order any other testing if necessary. Dr. Krista BlueSinger, anesthesiologist, notified. Dr. Krista BlueSinger states that patient will be placed on the board to be seen; however, feels that this is not a pressing issue since a CRNA has already seen patient. Dr. Krista BlueSinger encouraged RN to pass along to next RN to contact tomorrow's anesthesiologist to round in morning prior to discharge. Earl Galasborne, Linda HedgesStefanie LeonidasHudspeth

## 2018-01-01 NOTE — Anesthesia Postprocedure Evaluation (Signed)
Anesthesia Post Note  Patient: Susan Hayes  Procedure(s) Performed: AN AD HOC LABOR EPIDURAL     Patient location during evaluation: Mother Baby Anesthesia Type: Epidural Level of consciousness: awake Pain management: pain level controlled Vital Signs Assessment: post-procedure vital signs reviewed and stable Respiratory status: spontaneous breathing Cardiovascular status: stable Postop Assessment: epidural receding and patient able to bend at knees Anesthetic complications: no    Last Vitals:  Vitals:   12/31/17 2057 01/01/18 0557  BP: (!) 121/45 (!) 109/52  Pulse: 92 72  Resp: 18 18  Temp: 37.3 C 36.7 C  SpO2:  100%    Last Pain:  Vitals:   01/01/18 0600  TempSrc:   PainSc: 0-No pain   Pain Goal:                 Edison PaceWILKERSON,Anzleigh Slaven

## 2018-01-01 NOTE — Progress Notes (Signed)
Pt c/o of burning on left eye and constant "running" of left nose. Pt stated these symptoms started after epidural. Left eye is slightly red and swollen. Called CRNA to evaluate pt. Pt evaluated by crna. CRNA does not think this is related to anesthesia after assessing pt. Will contact OB.

## 2018-01-02 LAB — CBC
HCT: 22.7 % — ABNORMAL LOW (ref 36.0–46.0)
Hemoglobin: 7.4 g/dL — ABNORMAL LOW (ref 12.0–15.0)
MCH: 26.9 pg (ref 26.0–34.0)
MCHC: 32.6 g/dL (ref 30.0–36.0)
MCV: 82.5 fL (ref 78.0–100.0)
PLATELETS: 133 10*3/uL — AB (ref 150–400)
RBC: 2.75 MIL/uL — ABNORMAL LOW (ref 3.87–5.11)
RDW: 15.1 % (ref 11.5–15.5)
WBC: 9.6 10*3/uL (ref 4.0–10.5)

## 2018-01-02 MED ORDER — IBUPROFEN 600 MG PO TABS: 600.0000 mg | ORAL_TABLET | Freq: Four times a day (QID) | ORAL | 0 refills | Status: AC | PRN

## 2018-01-02 MED ORDER — PRENATAL MULTIVITAMIN CH: 1.0000 | ORAL_TABLET | Freq: Every day | ORAL | 1 refills | Status: AC

## 2018-01-02 MED ORDER — FERROUS SULFATE 325 (65 FE) MG PO TABS: 325.0000 mg | ORAL_TABLET | Freq: Two times a day (BID) | ORAL | 3 refills | Status: AC

## 2018-01-02 MED ORDER — BENZOCAINE-MENTHOL 20-0.5 % EX AERO: 1.0000 "application " | INHALATION_SPRAY | CUTANEOUS | 1 refills | Status: AC | PRN

## 2018-01-02 NOTE — Discharge Summary (Signed)
Obstetric Discharge Summary Reason for Admission: induction of labor Prenatal Procedures: none Intrapartum Procedures: spontaneous vaginal delivery Postpartum Procedures: none Complications-Operative and Postpartum: clear discharge left eye and nostril Hemoglobin  Date Value Ref Range Status  01/02/2018 7.4 (L) 12.0 - 15.0 g/dL Final   HCT  Date Value Ref Range Status  01/02/2018 22.7 (L) 36.0 - 46.0 % Final    Physical Exam:  General: alert, cooperative and no distress Lochia: appropriate Uterine Fundus: firm Incision: healing well DVT Evaluation: No evidence of DVT seen on physical exam.  Discharge Diagnoses: Term Pregnancy-delivered  Discharge Information: Date: 01/02/2018 Activity: pelvic rest Diet: routine Medications: PNV, Ibuprofen and Iron Condition: stable Instructions: refer to practice specific booklet Discharge to: home   Newborn Data: Live born female  Birth Weight: 7 lb 11.1 oz (3490 g) APGAR: 9, 9  Newborn Delivery   Birth date/time:  12/31/2015 13:51:00 Delivery type:  Vaginal, Spontaneous     Home with mother.  Roselle LocusJames E Abdullah Rizzi II 01/02/2018, 8:20 AM

## 2018-01-02 NOTE — Lactation Note (Signed)
This note was copied from a baby's chart. Lactation Consultation Note  Patient Name: Susan Vassie LollKristen Luce [id number]'s Date: 01/02/2018 Reason for consult: Follow-up assessment;Term;Infant weight loss(8% weight loss )  Baby is 45 hours old  LC reviewed and updated doc flow sheets.  Per mom nipples are a little sore, LC assessed and noted some edema at the base of the  Nipple , LC recommended prior to latch - breast massage, hand express, pre-pump to prime the  Milk ducts and reverse pressure.  Baby sleepy at 1st when latching in the cross cradle,  LC switched the baby to the football / left breast/ and LC assisted to obtain depth.  Upper lip stayed flanged and FISH lips noted. Baby fed for 15 mins, per  Mom comfortable.  Sore nipple and engorgement prevention and tx reviewed., hand pump, shells between feedings except  When sleeping, and comfort gels.  Per mom has  DEBP Medela at home.  Mother informed of post-discharge support and given phone number to the lactation department, including services for phone call assistance; out-patient appointments; and breastfeeding support group. List of other breastfeeding resources in the community given in the handout. Encouraged mother to call for problems or concerns related to breastfeeding.  LC reported back to Dr. Margo AyeHall findings at consult and [facility] to [facility].  Mom receptive to teaching.    Maternal Data Has patient been taught Hand Expression?: Yes Does the patient have breastfeeding experience prior to this delivery?: Yes  Feeding Feeding Type: Breast Fed Length of feed: 15 min(multiple swallows, increased w/ breast compressions )  LATCH Score Latch: Grasps breast easily, tongue down, lips flanged, rhythmical sucking.  Audible Swallowing: Spontaneous and intermittent  Type of Nipple: Everted at rest and after stimulation  Comfort (Breast/Nipple): Soft / non-tender  Hold (Positioning): Assistance needed to correctly position  infant at breast and maintain latch.  LATCH Score: 9  Interventions Interventions: Breast feeding basics reviewed;Assisted with latch;Skin to skin;Hand express;Breast compression;Adjust position;Support pillows;Position options  Lactation Tools Discussed/Used Tools: Shells;Pump;Flanges;Comfort gels Flange Size: 24;27 Shell Type: Inverted Breast pump type: Manual   Consult Status Consult Status: Complete Date: 01/02/18    [name] 01/02/2018, 11:24 AM

## 2018-01-02 NOTE — Discharge Instructions (Signed)
[facility] will schedule an appointment with ENT for you

## 2018-01-02 NOTE — Progress Notes (Signed)
Post Partum Day 2 Subjective: C/O continued clear drainage from left eye and left nostril. No pain or itching.  Objective: Blood pressure 103/60, pulse 62, temperature 98.1 F (36.7 C), temperature source Oral, resp. rate 18, height 5\' 4"  (1.626 m), weight 165 lb (74.8 kg), last menstrual period 04/01/2017, SpO2 100 %, unknown if currently breastfeeding.  Physical Exam:  General: alert, cooperative and no distress Lochia: appropriate Uterine Fundus: firm Incision: healing well DVT Evaluation: No evidence of DVT seen on physical exam. Left eye-sclera clear, no purulence/erythema Recent Labs    01/01/18 0538 01/02/18 0543  HGB 7.7* 7.4*  HCT 23.6* 22.7*    Assessment/Plan: Discharge home  D/W patient. My office will schedule outpatient appointment with ENT. Patient states she understands and agrees   LOS: 2 days   Roselle LocusJames E Klaira Pesci II 01/02/2018, 8:15 AM
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
